# Patient Record
Sex: Female | Born: 1950
Health system: Southern US, Community
[De-identification: ages and names within clinical notes are randomized; demographics above are authoritative.]

## PROBLEM LIST (undated history)

## (undated) DIAGNOSIS — I10 Essential (primary) hypertension: Secondary | ICD-10-CM

## (undated) DIAGNOSIS — M1990 Unspecified osteoarthritis, unspecified site: Secondary | ICD-10-CM

## (undated) HISTORY — PX: GALLBLADDER SURGERY: SHX652

## (undated) HISTORY — PX: EYE MUSCLE SURGERY: SHX370

## (undated) HISTORY — PX: APPENDECTOMY: SHX54

## (undated) HISTORY — PX: PARTIAL HYSTERECTOMY: SHX80

## (undated) HISTORY — PX: BREAST SURGERY: SHX581

## (undated) HISTORY — PX: BACK SURGERY: SHX140

---

## 2015-09-15 DIAGNOSIS — K5900 Constipation, unspecified: Secondary | ICD-10-CM | POA: Diagnosis not present

## 2015-09-15 DIAGNOSIS — M5441 Lumbago with sciatica, right side: Secondary | ICD-10-CM | POA: Diagnosis not present

## 2015-09-24 DIAGNOSIS — E118 Type 2 diabetes mellitus with unspecified complications: Secondary | ICD-10-CM | POA: Diagnosis not present

## 2015-09-24 DIAGNOSIS — I10 Essential (primary) hypertension: Secondary | ICD-10-CM | POA: Diagnosis not present

## 2015-09-24 DIAGNOSIS — E1165 Type 2 diabetes mellitus with hyperglycemia: Secondary | ICD-10-CM | POA: Diagnosis not present

## 2015-09-24 DIAGNOSIS — M62838 Other muscle spasm: Secondary | ICD-10-CM | POA: Diagnosis not present

## 2015-10-29 DIAGNOSIS — Z299 Encounter for prophylactic measures, unspecified: Secondary | ICD-10-CM | POA: Diagnosis not present

## 2015-10-29 DIAGNOSIS — K219 Gastro-esophageal reflux disease without esophagitis: Secondary | ICD-10-CM | POA: Diagnosis not present

## 2015-10-29 DIAGNOSIS — Z1389 Encounter for screening for other disorder: Secondary | ICD-10-CM | POA: Diagnosis not present

## 2015-10-29 DIAGNOSIS — I10 Essential (primary) hypertension: Secondary | ICD-10-CM | POA: Diagnosis not present

## 2015-10-29 DIAGNOSIS — Z1211 Encounter for screening for malignant neoplasm of colon: Secondary | ICD-10-CM | POA: Diagnosis not present

## 2015-10-29 DIAGNOSIS — M545 Low back pain: Secondary | ICD-10-CM | POA: Diagnosis not present

## 2015-10-29 DIAGNOSIS — E118 Type 2 diabetes mellitus with unspecified complications: Secondary | ICD-10-CM | POA: Diagnosis not present

## 2015-10-29 DIAGNOSIS — Z7189 Other specified counseling: Secondary | ICD-10-CM | POA: Diagnosis not present

## 2015-10-29 DIAGNOSIS — Z0000 Encounter for general adult medical examination without abnormal findings: Secondary | ICD-10-CM | POA: Diagnosis not present

## 2015-10-31 DIAGNOSIS — Z79899 Other long term (current) drug therapy: Secondary | ICD-10-CM | POA: Diagnosis not present

## 2015-10-31 DIAGNOSIS — E7800 Pure hypercholesterolemia, unspecified: Secondary | ICD-10-CM | POA: Diagnosis not present

## 2015-10-31 DIAGNOSIS — R5383 Other fatigue: Secondary | ICD-10-CM | POA: Diagnosis not present

## 2015-11-04 DIAGNOSIS — M810 Age-related osteoporosis without current pathological fracture: Secondary | ICD-10-CM | POA: Diagnosis not present

## 2015-11-06 DIAGNOSIS — Z1231 Encounter for screening mammogram for malignant neoplasm of breast: Secondary | ICD-10-CM | POA: Diagnosis not present

## 2015-11-10 DIAGNOSIS — R7309 Other abnormal glucose: Secondary | ICD-10-CM | POA: Diagnosis not present

## 2015-11-10 DIAGNOSIS — M545 Low back pain: Secondary | ICD-10-CM | POA: Diagnosis not present

## 2015-11-21 DIAGNOSIS — M159 Polyosteoarthritis, unspecified: Secondary | ICD-10-CM | POA: Diagnosis not present

## 2015-11-21 DIAGNOSIS — E119 Type 2 diabetes mellitus without complications: Secondary | ICD-10-CM | POA: Diagnosis not present

## 2015-11-21 DIAGNOSIS — I10 Essential (primary) hypertension: Secondary | ICD-10-CM | POA: Diagnosis not present

## 2015-12-03 DIAGNOSIS — D649 Anemia, unspecified: Secondary | ICD-10-CM | POA: Diagnosis not present

## 2015-12-03 DIAGNOSIS — Z1211 Encounter for screening for malignant neoplasm of colon: Secondary | ICD-10-CM | POA: Diagnosis not present

## 2015-12-03 DIAGNOSIS — K5900 Constipation, unspecified: Secondary | ICD-10-CM | POA: Diagnosis not present

## 2015-12-03 DIAGNOSIS — Z800 Family history of malignant neoplasm of digestive organs: Secondary | ICD-10-CM | POA: Diagnosis not present

## 2015-12-29 DIAGNOSIS — M159 Polyosteoarthritis, unspecified: Secondary | ICD-10-CM | POA: Diagnosis not present

## 2015-12-29 DIAGNOSIS — I10 Essential (primary) hypertension: Secondary | ICD-10-CM | POA: Diagnosis not present

## 2015-12-29 DIAGNOSIS — E119 Type 2 diabetes mellitus without complications: Secondary | ICD-10-CM | POA: Diagnosis not present

## 2015-12-31 DIAGNOSIS — R600 Localized edema: Secondary | ICD-10-CM | POA: Diagnosis not present

## 2015-12-31 DIAGNOSIS — E1165 Type 2 diabetes mellitus with hyperglycemia: Secondary | ICD-10-CM | POA: Diagnosis not present

## 2015-12-31 DIAGNOSIS — Z299 Encounter for prophylactic measures, unspecified: Secondary | ICD-10-CM | POA: Diagnosis not present

## 2015-12-31 DIAGNOSIS — M545 Low back pain: Secondary | ICD-10-CM | POA: Diagnosis not present

## 2015-12-31 DIAGNOSIS — Z789 Other specified health status: Secondary | ICD-10-CM | POA: Diagnosis not present

## 2016-01-01 DIAGNOSIS — K529 Noninfective gastroenteritis and colitis, unspecified: Secondary | ICD-10-CM | POA: Diagnosis not present

## 2016-01-01 DIAGNOSIS — K635 Polyp of colon: Secondary | ICD-10-CM | POA: Diagnosis not present

## 2016-01-01 DIAGNOSIS — Z1211 Encounter for screening for malignant neoplasm of colon: Secondary | ICD-10-CM | POA: Diagnosis not present

## 2016-01-01 DIAGNOSIS — K5900 Constipation, unspecified: Secondary | ICD-10-CM | POA: Diagnosis not present

## 2016-01-28 DIAGNOSIS — K635 Polyp of colon: Secondary | ICD-10-CM | POA: Diagnosis not present

## 2016-01-28 DIAGNOSIS — D649 Anemia, unspecified: Secondary | ICD-10-CM | POA: Diagnosis not present

## 2016-01-28 DIAGNOSIS — Z1211 Encounter for screening for malignant neoplasm of colon: Secondary | ICD-10-CM | POA: Diagnosis not present

## 2016-01-28 DIAGNOSIS — K5900 Constipation, unspecified: Secondary | ICD-10-CM | POA: Diagnosis not present

## 2016-02-27 DIAGNOSIS — E119 Type 2 diabetes mellitus without complications: Secondary | ICD-10-CM | POA: Diagnosis not present

## 2016-02-27 DIAGNOSIS — I10 Essential (primary) hypertension: Secondary | ICD-10-CM | POA: Diagnosis not present

## 2016-02-27 DIAGNOSIS — M159 Polyosteoarthritis, unspecified: Secondary | ICD-10-CM | POA: Diagnosis not present

## 2016-04-06 DIAGNOSIS — E1165 Type 2 diabetes mellitus with hyperglycemia: Secondary | ICD-10-CM | POA: Diagnosis not present

## 2016-04-06 DIAGNOSIS — I10 Essential (primary) hypertension: Secondary | ICD-10-CM | POA: Diagnosis not present

## 2016-04-06 DIAGNOSIS — E118 Type 2 diabetes mellitus with unspecified complications: Secondary | ICD-10-CM | POA: Diagnosis not present

## 2016-04-06 DIAGNOSIS — Z299 Encounter for prophylactic measures, unspecified: Secondary | ICD-10-CM | POA: Diagnosis not present

## 2016-04-06 DIAGNOSIS — M545 Low back pain: Secondary | ICD-10-CM | POA: Diagnosis not present

## 2016-04-09 DIAGNOSIS — M159 Polyosteoarthritis, unspecified: Secondary | ICD-10-CM | POA: Diagnosis not present

## 2016-04-09 DIAGNOSIS — I10 Essential (primary) hypertension: Secondary | ICD-10-CM | POA: Diagnosis not present

## 2016-04-09 DIAGNOSIS — E119 Type 2 diabetes mellitus without complications: Secondary | ICD-10-CM | POA: Diagnosis not present

## 2016-05-13 DIAGNOSIS — I10 Essential (primary) hypertension: Secondary | ICD-10-CM | POA: Diagnosis not present

## 2016-05-13 DIAGNOSIS — E119 Type 2 diabetes mellitus without complications: Secondary | ICD-10-CM | POA: Diagnosis not present

## 2016-05-13 DIAGNOSIS — M159 Polyosteoarthritis, unspecified: Secondary | ICD-10-CM | POA: Diagnosis not present

## 2016-07-27 DIAGNOSIS — I10 Essential (primary) hypertension: Secondary | ICD-10-CM | POA: Diagnosis not present

## 2016-07-27 DIAGNOSIS — M25551 Pain in right hip: Secondary | ICD-10-CM | POA: Diagnosis not present

## 2016-07-27 DIAGNOSIS — E119 Type 2 diabetes mellitus without complications: Secondary | ICD-10-CM | POA: Diagnosis not present

## 2016-07-28 DIAGNOSIS — E119 Type 2 diabetes mellitus without complications: Secondary | ICD-10-CM | POA: Diagnosis not present

## 2016-07-28 DIAGNOSIS — M159 Polyosteoarthritis, unspecified: Secondary | ICD-10-CM | POA: Diagnosis not present

## 2016-07-28 DIAGNOSIS — I10 Essential (primary) hypertension: Secondary | ICD-10-CM | POA: Diagnosis not present

## 2016-09-24 DIAGNOSIS — I10 Essential (primary) hypertension: Secondary | ICD-10-CM | POA: Diagnosis not present

## 2016-09-24 DIAGNOSIS — M159 Polyosteoarthritis, unspecified: Secondary | ICD-10-CM | POA: Diagnosis not present

## 2016-09-24 DIAGNOSIS — E119 Type 2 diabetes mellitus without complications: Secondary | ICD-10-CM | POA: Diagnosis not present

## 2016-10-16 DIAGNOSIS — R5381 Other malaise: Secondary | ICD-10-CM | POA: Diagnosis not present

## 2016-10-16 DIAGNOSIS — J019 Acute sinusitis, unspecified: Secondary | ICD-10-CM | POA: Diagnosis not present

## 2016-10-16 DIAGNOSIS — R05 Cough: Secondary | ICD-10-CM | POA: Diagnosis not present

## 2016-10-25 DIAGNOSIS — M159 Polyosteoarthritis, unspecified: Secondary | ICD-10-CM | POA: Diagnosis not present

## 2016-10-25 DIAGNOSIS — E119 Type 2 diabetes mellitus without complications: Secondary | ICD-10-CM | POA: Diagnosis not present

## 2016-10-25 DIAGNOSIS — I10 Essential (primary) hypertension: Secondary | ICD-10-CM | POA: Diagnosis not present

## 2016-10-29 DIAGNOSIS — J309 Allergic rhinitis, unspecified: Secondary | ICD-10-CM | POA: Diagnosis not present

## 2016-10-29 DIAGNOSIS — Z1389 Encounter for screening for other disorder: Secondary | ICD-10-CM | POA: Diagnosis not present

## 2016-10-29 DIAGNOSIS — E7800 Pure hypercholesterolemia, unspecified: Secondary | ICD-10-CM | POA: Diagnosis not present

## 2016-10-29 DIAGNOSIS — Z6841 Body Mass Index (BMI) 40.0 and over, adult: Secondary | ICD-10-CM | POA: Diagnosis not present

## 2016-10-29 DIAGNOSIS — Z299 Encounter for prophylactic measures, unspecified: Secondary | ICD-10-CM | POA: Diagnosis not present

## 2016-10-29 DIAGNOSIS — Z7189 Other specified counseling: Secondary | ICD-10-CM | POA: Diagnosis not present

## 2016-10-29 DIAGNOSIS — R5383 Other fatigue: Secondary | ICD-10-CM | POA: Diagnosis not present

## 2016-10-29 DIAGNOSIS — I10 Essential (primary) hypertension: Secondary | ICD-10-CM | POA: Diagnosis not present

## 2016-10-29 DIAGNOSIS — Z0000 Encounter for general adult medical examination without abnormal findings: Secondary | ICD-10-CM | POA: Diagnosis not present

## 2016-10-29 DIAGNOSIS — Z79899 Other long term (current) drug therapy: Secondary | ICD-10-CM | POA: Diagnosis not present

## 2016-10-29 DIAGNOSIS — E1165 Type 2 diabetes mellitus with hyperglycemia: Secondary | ICD-10-CM | POA: Diagnosis not present

## 2016-11-08 DIAGNOSIS — Z1231 Encounter for screening mammogram for malignant neoplasm of breast: Secondary | ICD-10-CM | POA: Diagnosis not present

## 2016-11-24 DIAGNOSIS — M159 Polyosteoarthritis, unspecified: Secondary | ICD-10-CM | POA: Diagnosis not present

## 2016-11-24 DIAGNOSIS — I10 Essential (primary) hypertension: Secondary | ICD-10-CM | POA: Diagnosis not present

## 2016-11-24 DIAGNOSIS — E119 Type 2 diabetes mellitus without complications: Secondary | ICD-10-CM | POA: Diagnosis not present

## 2016-12-17 DIAGNOSIS — E119 Type 2 diabetes mellitus without complications: Secondary | ICD-10-CM | POA: Diagnosis not present

## 2016-12-17 DIAGNOSIS — I10 Essential (primary) hypertension: Secondary | ICD-10-CM | POA: Diagnosis not present

## 2016-12-17 DIAGNOSIS — M159 Polyosteoarthritis, unspecified: Secondary | ICD-10-CM | POA: Diagnosis not present

## 2017-01-20 DIAGNOSIS — I10 Essential (primary) hypertension: Secondary | ICD-10-CM | POA: Diagnosis not present

## 2017-01-20 DIAGNOSIS — E119 Type 2 diabetes mellitus without complications: Secondary | ICD-10-CM | POA: Diagnosis not present

## 2017-01-20 DIAGNOSIS — M159 Polyosteoarthritis, unspecified: Secondary | ICD-10-CM | POA: Diagnosis not present

## 2017-02-08 DIAGNOSIS — E119 Type 2 diabetes mellitus without complications: Secondary | ICD-10-CM | POA: Diagnosis not present

## 2017-02-08 DIAGNOSIS — M159 Polyosteoarthritis, unspecified: Secondary | ICD-10-CM | POA: Diagnosis not present

## 2017-02-08 DIAGNOSIS — I10 Essential (primary) hypertension: Secondary | ICD-10-CM | POA: Diagnosis not present

## 2017-03-11 DIAGNOSIS — Z6841 Body Mass Index (BMI) 40.0 and over, adult: Secondary | ICD-10-CM | POA: Diagnosis not present

## 2017-03-11 DIAGNOSIS — E7800 Pure hypercholesterolemia, unspecified: Secondary | ICD-10-CM | POA: Diagnosis not present

## 2017-03-11 DIAGNOSIS — E1165 Type 2 diabetes mellitus with hyperglycemia: Secondary | ICD-10-CM | POA: Diagnosis not present

## 2017-03-11 DIAGNOSIS — M545 Low back pain: Secondary | ICD-10-CM | POA: Diagnosis not present

## 2017-03-11 DIAGNOSIS — Z713 Dietary counseling and surveillance: Secondary | ICD-10-CM | POA: Diagnosis not present

## 2017-03-11 DIAGNOSIS — I10 Essential (primary) hypertension: Secondary | ICD-10-CM | POA: Diagnosis not present

## 2017-03-11 DIAGNOSIS — Z299 Encounter for prophylactic measures, unspecified: Secondary | ICD-10-CM | POA: Diagnosis not present

## 2017-04-22 DIAGNOSIS — I10 Essential (primary) hypertension: Secondary | ICD-10-CM | POA: Diagnosis not present

## 2017-04-22 DIAGNOSIS — E119 Type 2 diabetes mellitus without complications: Secondary | ICD-10-CM | POA: Diagnosis not present

## 2017-04-22 DIAGNOSIS — M159 Polyosteoarthritis, unspecified: Secondary | ICD-10-CM | POA: Diagnosis not present

## 2017-05-13 DIAGNOSIS — M159 Polyosteoarthritis, unspecified: Secondary | ICD-10-CM | POA: Diagnosis not present

## 2017-05-13 DIAGNOSIS — E119 Type 2 diabetes mellitus without complications: Secondary | ICD-10-CM | POA: Diagnosis not present

## 2017-05-13 DIAGNOSIS — I10 Essential (primary) hypertension: Secondary | ICD-10-CM | POA: Diagnosis not present

## 2017-06-17 DIAGNOSIS — Z299 Encounter for prophylactic measures, unspecified: Secondary | ICD-10-CM | POA: Diagnosis not present

## 2017-06-17 DIAGNOSIS — M545 Low back pain: Secondary | ICD-10-CM | POA: Diagnosis not present

## 2017-06-17 DIAGNOSIS — E1165 Type 2 diabetes mellitus with hyperglycemia: Secondary | ICD-10-CM | POA: Diagnosis not present

## 2017-06-17 DIAGNOSIS — Z6841 Body Mass Index (BMI) 40.0 and over, adult: Secondary | ICD-10-CM | POA: Diagnosis not present

## 2017-06-17 DIAGNOSIS — I10 Essential (primary) hypertension: Secondary | ICD-10-CM | POA: Diagnosis not present

## 2017-06-24 DIAGNOSIS — M159 Polyosteoarthritis, unspecified: Secondary | ICD-10-CM | POA: Diagnosis not present

## 2017-06-24 DIAGNOSIS — I10 Essential (primary) hypertension: Secondary | ICD-10-CM | POA: Diagnosis not present

## 2017-06-24 DIAGNOSIS — E119 Type 2 diabetes mellitus without complications: Secondary | ICD-10-CM | POA: Diagnosis not present

## 2017-07-20 DIAGNOSIS — M159 Polyosteoarthritis, unspecified: Secondary | ICD-10-CM | POA: Diagnosis not present

## 2017-07-20 DIAGNOSIS — I10 Essential (primary) hypertension: Secondary | ICD-10-CM | POA: Diagnosis not present

## 2017-07-20 DIAGNOSIS — E119 Type 2 diabetes mellitus without complications: Secondary | ICD-10-CM | POA: Diagnosis not present

## 2017-08-11 DIAGNOSIS — M159 Polyosteoarthritis, unspecified: Secondary | ICD-10-CM | POA: Diagnosis not present

## 2017-08-11 DIAGNOSIS — E119 Type 2 diabetes mellitus without complications: Secondary | ICD-10-CM | POA: Diagnosis not present

## 2017-08-11 DIAGNOSIS — I10 Essential (primary) hypertension: Secondary | ICD-10-CM | POA: Diagnosis not present

## 2017-09-09 DIAGNOSIS — M159 Polyosteoarthritis, unspecified: Secondary | ICD-10-CM | POA: Diagnosis not present

## 2017-09-09 DIAGNOSIS — I10 Essential (primary) hypertension: Secondary | ICD-10-CM | POA: Diagnosis not present

## 2017-09-09 DIAGNOSIS — E119 Type 2 diabetes mellitus without complications: Secondary | ICD-10-CM | POA: Diagnosis not present

## 2017-09-23 DIAGNOSIS — E1165 Type 2 diabetes mellitus with hyperglycemia: Secondary | ICD-10-CM | POA: Diagnosis not present

## 2017-09-23 DIAGNOSIS — M545 Low back pain: Secondary | ICD-10-CM | POA: Diagnosis not present

## 2017-09-23 DIAGNOSIS — Z79899 Other long term (current) drug therapy: Secondary | ICD-10-CM | POA: Diagnosis not present

## 2017-09-23 DIAGNOSIS — Z299 Encounter for prophylactic measures, unspecified: Secondary | ICD-10-CM | POA: Diagnosis not present

## 2017-10-05 DIAGNOSIS — I10 Essential (primary) hypertension: Secondary | ICD-10-CM | POA: Diagnosis not present

## 2017-10-05 DIAGNOSIS — M159 Polyosteoarthritis, unspecified: Secondary | ICD-10-CM | POA: Diagnosis not present

## 2017-10-05 DIAGNOSIS — E119 Type 2 diabetes mellitus without complications: Secondary | ICD-10-CM | POA: Diagnosis not present

## 2017-11-01 DIAGNOSIS — Z0131 Encounter for examination of blood pressure with abnormal findings: Secondary | ICD-10-CM | POA: Diagnosis not present

## 2017-11-01 DIAGNOSIS — M549 Dorsalgia, unspecified: Secondary | ICD-10-CM | POA: Diagnosis not present

## 2017-11-04 DIAGNOSIS — E119 Type 2 diabetes mellitus without complications: Secondary | ICD-10-CM | POA: Diagnosis not present

## 2017-11-04 DIAGNOSIS — I10 Essential (primary) hypertension: Secondary | ICD-10-CM | POA: Diagnosis not present

## 2017-11-04 DIAGNOSIS — M159 Polyosteoarthritis, unspecified: Secondary | ICD-10-CM | POA: Diagnosis not present

## 2017-11-08 DIAGNOSIS — I10 Essential (primary) hypertension: Secondary | ICD-10-CM | POA: Diagnosis not present

## 2017-11-08 DIAGNOSIS — Z6841 Body Mass Index (BMI) 40.0 and over, adult: Secondary | ICD-10-CM | POA: Diagnosis not present

## 2017-11-08 DIAGNOSIS — Z1331 Encounter for screening for depression: Secondary | ICD-10-CM | POA: Diagnosis not present

## 2017-11-08 DIAGNOSIS — Z7189 Other specified counseling: Secondary | ICD-10-CM | POA: Diagnosis not present

## 2017-11-08 DIAGNOSIS — R5383 Other fatigue: Secondary | ICD-10-CM | POA: Diagnosis not present

## 2017-11-08 DIAGNOSIS — Z79899 Other long term (current) drug therapy: Secondary | ICD-10-CM | POA: Diagnosis not present

## 2017-11-08 DIAGNOSIS — Z1211 Encounter for screening for malignant neoplasm of colon: Secondary | ICD-10-CM | POA: Diagnosis not present

## 2017-11-08 DIAGNOSIS — E7800 Pure hypercholesterolemia, unspecified: Secondary | ICD-10-CM | POA: Diagnosis not present

## 2017-11-08 DIAGNOSIS — Z1339 Encounter for screening examination for other mental health and behavioral disorders: Secondary | ICD-10-CM | POA: Diagnosis not present

## 2017-11-08 DIAGNOSIS — E1165 Type 2 diabetes mellitus with hyperglycemia: Secondary | ICD-10-CM | POA: Diagnosis not present

## 2017-11-08 DIAGNOSIS — Z0000 Encounter for general adult medical examination without abnormal findings: Secondary | ICD-10-CM | POA: Diagnosis not present

## 2017-11-08 DIAGNOSIS — Z299 Encounter for prophylactic measures, unspecified: Secondary | ICD-10-CM | POA: Diagnosis not present

## 2017-11-08 DIAGNOSIS — M5416 Radiculopathy, lumbar region: Secondary | ICD-10-CM | POA: Diagnosis not present

## 2017-11-21 DIAGNOSIS — Z1231 Encounter for screening mammogram for malignant neoplasm of breast: Secondary | ICD-10-CM | POA: Diagnosis not present

## 2017-11-21 DIAGNOSIS — R921 Mammographic calcification found on diagnostic imaging of breast: Secondary | ICD-10-CM | POA: Diagnosis not present

## 2017-11-29 DIAGNOSIS — M545 Low back pain: Secondary | ICD-10-CM | POA: Diagnosis not present

## 2017-11-29 DIAGNOSIS — M5136 Other intervertebral disc degeneration, lumbar region: Secondary | ICD-10-CM | POA: Diagnosis not present

## 2017-12-09 DIAGNOSIS — I10 Essential (primary) hypertension: Secondary | ICD-10-CM | POA: Diagnosis not present

## 2017-12-09 DIAGNOSIS — M159 Polyosteoarthritis, unspecified: Secondary | ICD-10-CM | POA: Diagnosis not present

## 2017-12-09 DIAGNOSIS — E119 Type 2 diabetes mellitus without complications: Secondary | ICD-10-CM | POA: Diagnosis not present

## 2017-12-13 DIAGNOSIS — M5136 Other intervertebral disc degeneration, lumbar region: Secondary | ICD-10-CM | POA: Diagnosis not present

## 2017-12-13 DIAGNOSIS — M545 Low back pain: Secondary | ICD-10-CM | POA: Diagnosis not present

## 2017-12-13 DIAGNOSIS — M4726 Other spondylosis with radiculopathy, lumbar region: Secondary | ICD-10-CM | POA: Diagnosis not present

## 2017-12-13 DIAGNOSIS — M6281 Muscle weakness (generalized): Secondary | ICD-10-CM | POA: Diagnosis not present

## 2017-12-15 DIAGNOSIS — M545 Low back pain: Secondary | ICD-10-CM | POA: Diagnosis not present

## 2017-12-15 DIAGNOSIS — M5136 Other intervertebral disc degeneration, lumbar region: Secondary | ICD-10-CM | POA: Diagnosis not present

## 2017-12-15 DIAGNOSIS — M4726 Other spondylosis with radiculopathy, lumbar region: Secondary | ICD-10-CM | POA: Diagnosis not present

## 2017-12-15 DIAGNOSIS — M6281 Muscle weakness (generalized): Secondary | ICD-10-CM | POA: Diagnosis not present

## 2017-12-20 DIAGNOSIS — M4726 Other spondylosis with radiculopathy, lumbar region: Secondary | ICD-10-CM | POA: Diagnosis not present

## 2017-12-20 DIAGNOSIS — M5136 Other intervertebral disc degeneration, lumbar region: Secondary | ICD-10-CM | POA: Diagnosis not present

## 2017-12-20 DIAGNOSIS — M6281 Muscle weakness (generalized): Secondary | ICD-10-CM | POA: Diagnosis not present

## 2017-12-20 DIAGNOSIS — M545 Low back pain: Secondary | ICD-10-CM | POA: Diagnosis not present

## 2017-12-22 DIAGNOSIS — M6281 Muscle weakness (generalized): Secondary | ICD-10-CM | POA: Diagnosis not present

## 2017-12-22 DIAGNOSIS — M4726 Other spondylosis with radiculopathy, lumbar region: Secondary | ICD-10-CM | POA: Diagnosis not present

## 2017-12-22 DIAGNOSIS — M545 Low back pain: Secondary | ICD-10-CM | POA: Diagnosis not present

## 2017-12-22 DIAGNOSIS — M5136 Other intervertebral disc degeneration, lumbar region: Secondary | ICD-10-CM | POA: Diagnosis not present

## 2017-12-27 DIAGNOSIS — M5136 Other intervertebral disc degeneration, lumbar region: Secondary | ICD-10-CM | POA: Diagnosis not present

## 2017-12-27 DIAGNOSIS — M6281 Muscle weakness (generalized): Secondary | ICD-10-CM | POA: Diagnosis not present

## 2017-12-27 DIAGNOSIS — M545 Low back pain: Secondary | ICD-10-CM | POA: Diagnosis not present

## 2017-12-27 DIAGNOSIS — M4726 Other spondylosis with radiculopathy, lumbar region: Secondary | ICD-10-CM | POA: Diagnosis not present

## 2017-12-29 DIAGNOSIS — M6281 Muscle weakness (generalized): Secondary | ICD-10-CM | POA: Diagnosis not present

## 2017-12-29 DIAGNOSIS — Z299 Encounter for prophylactic measures, unspecified: Secondary | ICD-10-CM | POA: Diagnosis not present

## 2017-12-29 DIAGNOSIS — E118 Type 2 diabetes mellitus with unspecified complications: Secondary | ICD-10-CM | POA: Diagnosis not present

## 2017-12-29 DIAGNOSIS — I10 Essential (primary) hypertension: Secondary | ICD-10-CM | POA: Diagnosis not present

## 2017-12-29 DIAGNOSIS — E1165 Type 2 diabetes mellitus with hyperglycemia: Secondary | ICD-10-CM | POA: Diagnosis not present

## 2017-12-29 DIAGNOSIS — M4726 Other spondylosis with radiculopathy, lumbar region: Secondary | ICD-10-CM | POA: Diagnosis not present

## 2017-12-29 DIAGNOSIS — Z6841 Body Mass Index (BMI) 40.0 and over, adult: Secondary | ICD-10-CM | POA: Diagnosis not present

## 2017-12-29 DIAGNOSIS — M5136 Other intervertebral disc degeneration, lumbar region: Secondary | ICD-10-CM | POA: Diagnosis not present

## 2017-12-29 DIAGNOSIS — M545 Low back pain: Secondary | ICD-10-CM | POA: Diagnosis not present

## 2018-01-03 DIAGNOSIS — M6281 Muscle weakness (generalized): Secondary | ICD-10-CM | POA: Diagnosis not present

## 2018-01-03 DIAGNOSIS — M5136 Other intervertebral disc degeneration, lumbar region: Secondary | ICD-10-CM | POA: Diagnosis not present

## 2018-01-03 DIAGNOSIS — M545 Low back pain: Secondary | ICD-10-CM | POA: Diagnosis not present

## 2018-01-03 DIAGNOSIS — M4726 Other spondylosis with radiculopathy, lumbar region: Secondary | ICD-10-CM | POA: Diagnosis not present

## 2018-01-16 DIAGNOSIS — Z299 Encounter for prophylactic measures, unspecified: Secondary | ICD-10-CM | POA: Diagnosis not present

## 2018-01-16 DIAGNOSIS — I10 Essential (primary) hypertension: Secondary | ICD-10-CM | POA: Diagnosis not present

## 2018-01-16 DIAGNOSIS — E1165 Type 2 diabetes mellitus with hyperglycemia: Secondary | ICD-10-CM | POA: Diagnosis not present

## 2018-01-16 DIAGNOSIS — Z6841 Body Mass Index (BMI) 40.0 and over, adult: Secondary | ICD-10-CM | POA: Diagnosis not present

## 2018-01-16 DIAGNOSIS — R0683 Snoring: Secondary | ICD-10-CM | POA: Diagnosis not present

## 2018-01-16 DIAGNOSIS — R0600 Dyspnea, unspecified: Secondary | ICD-10-CM | POA: Diagnosis not present

## 2018-01-17 DIAGNOSIS — M159 Polyosteoarthritis, unspecified: Secondary | ICD-10-CM | POA: Diagnosis not present

## 2018-01-17 DIAGNOSIS — E119 Type 2 diabetes mellitus without complications: Secondary | ICD-10-CM | POA: Diagnosis not present

## 2018-01-17 DIAGNOSIS — I10 Essential (primary) hypertension: Secondary | ICD-10-CM | POA: Diagnosis not present

## 2018-01-30 DIAGNOSIS — R0602 Shortness of breath: Secondary | ICD-10-CM | POA: Diagnosis not present

## 2018-02-03 ENCOUNTER — Institutional Professional Consult (permissible substitution): Payer: Self-pay | Admitting: Pulmonary Disease

## 2018-02-08 DIAGNOSIS — I10 Essential (primary) hypertension: Secondary | ICD-10-CM | POA: Diagnosis not present

## 2018-02-08 DIAGNOSIS — M159 Polyosteoarthritis, unspecified: Secondary | ICD-10-CM | POA: Diagnosis not present

## 2018-02-08 DIAGNOSIS — E119 Type 2 diabetes mellitus without complications: Secondary | ICD-10-CM | POA: Diagnosis not present

## 2018-03-31 DIAGNOSIS — I10 Essential (primary) hypertension: Secondary | ICD-10-CM | POA: Diagnosis not present

## 2018-03-31 DIAGNOSIS — M159 Polyosteoarthritis, unspecified: Secondary | ICD-10-CM | POA: Diagnosis not present

## 2018-03-31 DIAGNOSIS — E119 Type 2 diabetes mellitus without complications: Secondary | ICD-10-CM | POA: Diagnosis not present

## 2018-04-07 DIAGNOSIS — Z299 Encounter for prophylactic measures, unspecified: Secondary | ICD-10-CM | POA: Diagnosis not present

## 2018-04-07 DIAGNOSIS — M47816 Spondylosis without myelopathy or radiculopathy, lumbar region: Secondary | ICD-10-CM | POA: Diagnosis not present

## 2018-04-07 DIAGNOSIS — E1165 Type 2 diabetes mellitus with hyperglycemia: Secondary | ICD-10-CM | POA: Diagnosis not present

## 2018-04-07 DIAGNOSIS — Z6841 Body Mass Index (BMI) 40.0 and over, adult: Secondary | ICD-10-CM | POA: Diagnosis not present

## 2018-04-07 DIAGNOSIS — I10 Essential (primary) hypertension: Secondary | ICD-10-CM | POA: Diagnosis not present

## 2018-05-01 DIAGNOSIS — I10 Essential (primary) hypertension: Secondary | ICD-10-CM | POA: Diagnosis not present

## 2018-05-01 DIAGNOSIS — E119 Type 2 diabetes mellitus without complications: Secondary | ICD-10-CM | POA: Diagnosis not present

## 2018-05-01 DIAGNOSIS — M159 Polyosteoarthritis, unspecified: Secondary | ICD-10-CM | POA: Diagnosis not present

## 2018-05-26 DIAGNOSIS — Z299 Encounter for prophylactic measures, unspecified: Secondary | ICD-10-CM | POA: Diagnosis not present

## 2018-05-26 DIAGNOSIS — Z789 Other specified health status: Secondary | ICD-10-CM | POA: Diagnosis not present

## 2018-05-26 DIAGNOSIS — J40 Bronchitis, not specified as acute or chronic: Secondary | ICD-10-CM | POA: Diagnosis not present

## 2018-05-26 DIAGNOSIS — Z6841 Body Mass Index (BMI) 40.0 and over, adult: Secondary | ICD-10-CM | POA: Diagnosis not present

## 2018-05-26 DIAGNOSIS — I10 Essential (primary) hypertension: Secondary | ICD-10-CM | POA: Diagnosis not present

## 2018-05-29 DIAGNOSIS — M159 Polyosteoarthritis, unspecified: Secondary | ICD-10-CM | POA: Diagnosis not present

## 2018-05-29 DIAGNOSIS — I10 Essential (primary) hypertension: Secondary | ICD-10-CM | POA: Diagnosis not present

## 2018-05-29 DIAGNOSIS — E119 Type 2 diabetes mellitus without complications: Secondary | ICD-10-CM | POA: Diagnosis not present

## 2018-08-04 DIAGNOSIS — Z79899 Other long term (current) drug therapy: Secondary | ICD-10-CM | POA: Diagnosis not present

## 2018-08-04 DIAGNOSIS — I10 Essential (primary) hypertension: Secondary | ICD-10-CM | POA: Diagnosis not present

## 2018-08-04 DIAGNOSIS — Z299 Encounter for prophylactic measures, unspecified: Secondary | ICD-10-CM | POA: Diagnosis not present

## 2018-08-04 DIAGNOSIS — E1165 Type 2 diabetes mellitus with hyperglycemia: Secondary | ICD-10-CM | POA: Diagnosis not present

## 2018-08-04 DIAGNOSIS — M47816 Spondylosis without myelopathy or radiculopathy, lumbar region: Secondary | ICD-10-CM | POA: Diagnosis not present

## 2018-08-04 DIAGNOSIS — Z6841 Body Mass Index (BMI) 40.0 and over, adult: Secondary | ICD-10-CM | POA: Diagnosis not present

## 2018-08-11 DIAGNOSIS — I10 Essential (primary) hypertension: Secondary | ICD-10-CM | POA: Diagnosis not present

## 2018-08-11 DIAGNOSIS — E119 Type 2 diabetes mellitus without complications: Secondary | ICD-10-CM | POA: Diagnosis not present

## 2018-08-11 DIAGNOSIS — M159 Polyosteoarthritis, unspecified: Secondary | ICD-10-CM | POA: Diagnosis not present

## 2018-09-08 DIAGNOSIS — I10 Essential (primary) hypertension: Secondary | ICD-10-CM | POA: Diagnosis not present

## 2018-09-08 DIAGNOSIS — E1165 Type 2 diabetes mellitus with hyperglycemia: Secondary | ICD-10-CM | POA: Diagnosis not present

## 2018-09-08 DIAGNOSIS — Z6841 Body Mass Index (BMI) 40.0 and over, adult: Secondary | ICD-10-CM | POA: Diagnosis not present

## 2018-09-08 DIAGNOSIS — Z299 Encounter for prophylactic measures, unspecified: Secondary | ICD-10-CM | POA: Diagnosis not present

## 2018-10-10 DIAGNOSIS — E119 Type 2 diabetes mellitus without complications: Secondary | ICD-10-CM | POA: Diagnosis not present

## 2018-10-10 DIAGNOSIS — M159 Polyosteoarthritis, unspecified: Secondary | ICD-10-CM | POA: Diagnosis not present

## 2018-10-10 DIAGNOSIS — I10 Essential (primary) hypertension: Secondary | ICD-10-CM | POA: Diagnosis not present

## 2018-11-10 DIAGNOSIS — M159 Polyosteoarthritis, unspecified: Secondary | ICD-10-CM | POA: Diagnosis not present

## 2018-11-13 DIAGNOSIS — Z1331 Encounter for screening for depression: Secondary | ICD-10-CM | POA: Diagnosis not present

## 2018-11-13 DIAGNOSIS — E1165 Type 2 diabetes mellitus with hyperglycemia: Secondary | ICD-10-CM | POA: Diagnosis not present

## 2018-11-13 DIAGNOSIS — R5383 Other fatigue: Secondary | ICD-10-CM | POA: Diagnosis not present

## 2018-11-13 DIAGNOSIS — D509 Iron deficiency anemia, unspecified: Secondary | ICD-10-CM | POA: Diagnosis not present

## 2018-11-13 DIAGNOSIS — Z299 Encounter for prophylactic measures, unspecified: Secondary | ICD-10-CM | POA: Diagnosis not present

## 2018-11-13 DIAGNOSIS — Z7189 Other specified counseling: Secondary | ICD-10-CM | POA: Diagnosis not present

## 2018-11-13 DIAGNOSIS — I10 Essential (primary) hypertension: Secondary | ICD-10-CM | POA: Diagnosis not present

## 2018-11-13 DIAGNOSIS — Z0000 Encounter for general adult medical examination without abnormal findings: Secondary | ICD-10-CM | POA: Diagnosis not present

## 2018-11-13 DIAGNOSIS — Z1211 Encounter for screening for malignant neoplasm of colon: Secondary | ICD-10-CM | POA: Diagnosis not present

## 2018-11-13 DIAGNOSIS — E7800 Pure hypercholesterolemia, unspecified: Secondary | ICD-10-CM | POA: Diagnosis not present

## 2018-11-13 DIAGNOSIS — Z79899 Other long term (current) drug therapy: Secondary | ICD-10-CM | POA: Diagnosis not present

## 2018-11-13 DIAGNOSIS — Z1339 Encounter for screening examination for other mental health and behavioral disorders: Secondary | ICD-10-CM | POA: Diagnosis not present

## 2018-11-13 DIAGNOSIS — M545 Low back pain: Secondary | ICD-10-CM | POA: Diagnosis not present

## 2018-11-13 DIAGNOSIS — Z6841 Body Mass Index (BMI) 40.0 and over, adult: Secondary | ICD-10-CM | POA: Diagnosis not present

## 2018-12-01 DIAGNOSIS — I10 Essential (primary) hypertension: Secondary | ICD-10-CM | POA: Diagnosis not present

## 2018-12-08 DIAGNOSIS — M159 Polyosteoarthritis, unspecified: Secondary | ICD-10-CM | POA: Diagnosis not present

## 2018-12-19 DIAGNOSIS — I10 Essential (primary) hypertension: Secondary | ICD-10-CM | POA: Diagnosis not present

## 2018-12-25 DIAGNOSIS — Z1231 Encounter for screening mammogram for malignant neoplasm of breast: Secondary | ICD-10-CM | POA: Diagnosis not present

## 2018-12-27 DIAGNOSIS — R5383 Other fatigue: Secondary | ICD-10-CM | POA: Diagnosis not present

## 2019-01-03 DIAGNOSIS — Z86010 Personal history of colonic polyps: Secondary | ICD-10-CM | POA: Diagnosis not present

## 2019-01-03 DIAGNOSIS — D509 Iron deficiency anemia, unspecified: Secondary | ICD-10-CM | POA: Diagnosis not present

## 2019-01-03 DIAGNOSIS — Z01818 Encounter for other preprocedural examination: Secondary | ICD-10-CM | POA: Diagnosis not present

## 2019-01-03 DIAGNOSIS — E669 Obesity, unspecified: Secondary | ICD-10-CM | POA: Diagnosis not present

## 2019-01-03 DIAGNOSIS — Z1211 Encounter for screening for malignant neoplasm of colon: Secondary | ICD-10-CM | POA: Diagnosis not present

## 2019-01-03 DIAGNOSIS — Z800 Family history of malignant neoplasm of digestive organs: Secondary | ICD-10-CM | POA: Diagnosis not present

## 2019-01-08 DIAGNOSIS — M159 Polyosteoarthritis, unspecified: Secondary | ICD-10-CM | POA: Diagnosis not present

## 2019-01-11 DIAGNOSIS — Z86010 Personal history of colonic polyps: Secondary | ICD-10-CM | POA: Diagnosis not present

## 2019-01-11 DIAGNOSIS — D126 Benign neoplasm of colon, unspecified: Secondary | ICD-10-CM | POA: Diagnosis not present

## 2019-01-11 DIAGNOSIS — K635 Polyp of colon: Secondary | ICD-10-CM | POA: Diagnosis not present

## 2019-01-11 DIAGNOSIS — Z1211 Encounter for screening for malignant neoplasm of colon: Secondary | ICD-10-CM | POA: Diagnosis not present

## 2019-01-31 DIAGNOSIS — I10 Essential (primary) hypertension: Secondary | ICD-10-CM | POA: Diagnosis not present

## 2019-02-21 DIAGNOSIS — I10 Essential (primary) hypertension: Secondary | ICD-10-CM | POA: Diagnosis not present

## 2019-02-28 DIAGNOSIS — M159 Polyosteoarthritis, unspecified: Secondary | ICD-10-CM | POA: Diagnosis not present

## 2019-03-05 DIAGNOSIS — D509 Iron deficiency anemia, unspecified: Secondary | ICD-10-CM | POA: Diagnosis not present

## 2019-03-05 DIAGNOSIS — Z800 Family history of malignant neoplasm of digestive organs: Secondary | ICD-10-CM | POA: Diagnosis not present

## 2019-03-05 DIAGNOSIS — Z86010 Personal history of colonic polyps: Secondary | ICD-10-CM | POA: Diagnosis not present

## 2019-03-27 DIAGNOSIS — I10 Essential (primary) hypertension: Secondary | ICD-10-CM | POA: Diagnosis not present

## 2019-03-30 DIAGNOSIS — I10 Essential (primary) hypertension: Secondary | ICD-10-CM | POA: Diagnosis not present

## 2019-03-30 DIAGNOSIS — E1165 Type 2 diabetes mellitus with hyperglycemia: Secondary | ICD-10-CM | POA: Diagnosis not present

## 2019-03-30 DIAGNOSIS — Z299 Encounter for prophylactic measures, unspecified: Secondary | ICD-10-CM | POA: Diagnosis not present

## 2019-03-30 DIAGNOSIS — Z23 Encounter for immunization: Secondary | ICD-10-CM | POA: Diagnosis not present

## 2019-03-30 DIAGNOSIS — Z6841 Body Mass Index (BMI) 40.0 and over, adult: Secondary | ICD-10-CM | POA: Diagnosis not present

## 2019-03-30 DIAGNOSIS — M545 Low back pain: Secondary | ICD-10-CM | POA: Diagnosis not present

## 2019-04-17 DIAGNOSIS — M159 Polyosteoarthritis, unspecified: Secondary | ICD-10-CM | POA: Diagnosis not present

## 2019-04-25 DIAGNOSIS — I10 Essential (primary) hypertension: Secondary | ICD-10-CM | POA: Diagnosis not present

## 2019-06-06 DIAGNOSIS — D509 Iron deficiency anemia, unspecified: Secondary | ICD-10-CM | POA: Diagnosis not present

## 2019-06-06 DIAGNOSIS — E669 Obesity, unspecified: Secondary | ICD-10-CM | POA: Diagnosis not present

## 2019-06-06 DIAGNOSIS — Z800 Family history of malignant neoplasm of digestive organs: Secondary | ICD-10-CM | POA: Diagnosis not present

## 2019-06-06 DIAGNOSIS — Z86010 Personal history of colonic polyps: Secondary | ICD-10-CM | POA: Diagnosis not present

## 2019-06-11 DIAGNOSIS — Z01818 Encounter for other preprocedural examination: Secondary | ICD-10-CM | POA: Diagnosis not present

## 2019-06-19 DIAGNOSIS — Z800 Family history of malignant neoplasm of digestive organs: Secondary | ICD-10-CM | POA: Diagnosis not present

## 2019-06-19 DIAGNOSIS — I10 Essential (primary) hypertension: Secondary | ICD-10-CM | POA: Diagnosis not present

## 2019-06-19 DIAGNOSIS — D509 Iron deficiency anemia, unspecified: Secondary | ICD-10-CM | POA: Diagnosis not present

## 2019-06-19 DIAGNOSIS — K449 Diaphragmatic hernia without obstruction or gangrene: Secondary | ICD-10-CM | POA: Diagnosis not present

## 2019-06-19 DIAGNOSIS — K222 Esophageal obstruction: Secondary | ICD-10-CM | POA: Diagnosis not present

## 2019-06-19 DIAGNOSIS — K3189 Other diseases of stomach and duodenum: Secondary | ICD-10-CM | POA: Diagnosis not present

## 2019-06-19 DIAGNOSIS — K317 Polyp of stomach and duodenum: Secondary | ICD-10-CM | POA: Diagnosis not present

## 2019-07-04 DIAGNOSIS — M159 Polyosteoarthritis, unspecified: Secondary | ICD-10-CM | POA: Diagnosis not present

## 2019-07-16 DIAGNOSIS — E1165 Type 2 diabetes mellitus with hyperglycemia: Secondary | ICD-10-CM | POA: Diagnosis not present

## 2019-07-16 DIAGNOSIS — I10 Essential (primary) hypertension: Secondary | ICD-10-CM | POA: Diagnosis not present

## 2019-07-16 DIAGNOSIS — E119 Type 2 diabetes mellitus without complications: Secondary | ICD-10-CM | POA: Diagnosis not present

## 2019-07-16 DIAGNOSIS — K219 Gastro-esophageal reflux disease without esophagitis: Secondary | ICD-10-CM | POA: Diagnosis not present

## 2019-07-16 DIAGNOSIS — Z6841 Body Mass Index (BMI) 40.0 and over, adult: Secondary | ICD-10-CM | POA: Diagnosis not present

## 2019-07-16 DIAGNOSIS — Z299 Encounter for prophylactic measures, unspecified: Secondary | ICD-10-CM | POA: Diagnosis not present

## 2019-07-16 DIAGNOSIS — M47816 Spondylosis without myelopathy or radiculopathy, lumbar region: Secondary | ICD-10-CM | POA: Diagnosis not present

## 2019-08-03 DIAGNOSIS — I10 Essential (primary) hypertension: Secondary | ICD-10-CM | POA: Diagnosis not present

## 2019-08-15 DIAGNOSIS — M159 Polyosteoarthritis, unspecified: Secondary | ICD-10-CM | POA: Diagnosis not present

## 2019-09-02 DIAGNOSIS — I10 Essential (primary) hypertension: Secondary | ICD-10-CM | POA: Diagnosis not present

## 2019-09-10 DIAGNOSIS — M159 Polyosteoarthritis, unspecified: Secondary | ICD-10-CM | POA: Diagnosis not present

## 2019-10-02 DIAGNOSIS — I10 Essential (primary) hypertension: Secondary | ICD-10-CM | POA: Diagnosis not present

## 2019-10-22 DIAGNOSIS — Z299 Encounter for prophylactic measures, unspecified: Secondary | ICD-10-CM | POA: Diagnosis not present

## 2019-10-22 DIAGNOSIS — E7800 Pure hypercholesterolemia, unspecified: Secondary | ICD-10-CM | POA: Diagnosis not present

## 2019-10-22 DIAGNOSIS — Z6841 Body Mass Index (BMI) 40.0 and over, adult: Secondary | ICD-10-CM | POA: Diagnosis not present

## 2019-10-22 DIAGNOSIS — I10 Essential (primary) hypertension: Secondary | ICD-10-CM | POA: Diagnosis not present

## 2019-10-22 DIAGNOSIS — F322 Major depressive disorder, single episode, severe without psychotic features: Secondary | ICD-10-CM | POA: Diagnosis not present

## 2019-10-22 DIAGNOSIS — E1165 Type 2 diabetes mellitus with hyperglycemia: Secondary | ICD-10-CM | POA: Diagnosis not present

## 2019-11-02 DIAGNOSIS — I10 Essential (primary) hypertension: Secondary | ICD-10-CM | POA: Diagnosis not present

## 2019-11-20 DIAGNOSIS — E7800 Pure hypercholesterolemia, unspecified: Secondary | ICD-10-CM | POA: Diagnosis not present

## 2019-11-20 DIAGNOSIS — Z6841 Body Mass Index (BMI) 40.0 and over, adult: Secondary | ICD-10-CM | POA: Diagnosis not present

## 2019-11-20 DIAGNOSIS — Z7189 Other specified counseling: Secondary | ICD-10-CM | POA: Diagnosis not present

## 2019-11-20 DIAGNOSIS — Z299 Encounter for prophylactic measures, unspecified: Secondary | ICD-10-CM | POA: Diagnosis not present

## 2019-11-20 DIAGNOSIS — I10 Essential (primary) hypertension: Secondary | ICD-10-CM | POA: Diagnosis not present

## 2019-11-20 DIAGNOSIS — E1165 Type 2 diabetes mellitus with hyperglycemia: Secondary | ICD-10-CM | POA: Diagnosis not present

## 2019-11-20 DIAGNOSIS — R5383 Other fatigue: Secondary | ICD-10-CM | POA: Diagnosis not present

## 2019-11-20 DIAGNOSIS — Z1331 Encounter for screening for depression: Secondary | ICD-10-CM | POA: Diagnosis not present

## 2019-11-20 DIAGNOSIS — Z0000 Encounter for general adult medical examination without abnormal findings: Secondary | ICD-10-CM | POA: Diagnosis not present

## 2019-11-20 DIAGNOSIS — Z1339 Encounter for screening examination for other mental health and behavioral disorders: Secondary | ICD-10-CM | POA: Diagnosis not present

## 2019-11-20 DIAGNOSIS — Z1211 Encounter for screening for malignant neoplasm of colon: Secondary | ICD-10-CM | POA: Diagnosis not present

## 2019-11-30 DIAGNOSIS — E2839 Other primary ovarian failure: Secondary | ICD-10-CM | POA: Diagnosis not present

## 2019-12-02 DIAGNOSIS — M159 Polyosteoarthritis, unspecified: Secondary | ICD-10-CM | POA: Diagnosis not present

## 2019-12-02 DIAGNOSIS — I10 Essential (primary) hypertension: Secondary | ICD-10-CM | POA: Diagnosis not present

## 2020-01-02 DIAGNOSIS — M159 Polyosteoarthritis, unspecified: Secondary | ICD-10-CM | POA: Diagnosis not present

## 2020-01-02 DIAGNOSIS — I10 Essential (primary) hypertension: Secondary | ICD-10-CM | POA: Diagnosis not present

## 2020-01-31 DIAGNOSIS — Z299 Encounter for prophylactic measures, unspecified: Secondary | ICD-10-CM | POA: Diagnosis not present

## 2020-01-31 DIAGNOSIS — E1122 Type 2 diabetes mellitus with diabetic chronic kidney disease: Secondary | ICD-10-CM | POA: Diagnosis not present

## 2020-01-31 DIAGNOSIS — M47816 Spondylosis without myelopathy or radiculopathy, lumbar region: Secondary | ICD-10-CM | POA: Diagnosis not present

## 2020-01-31 DIAGNOSIS — R1030 Lower abdominal pain, unspecified: Secondary | ICD-10-CM | POA: Diagnosis not present

## 2020-01-31 DIAGNOSIS — Z6841 Body Mass Index (BMI) 40.0 and over, adult: Secondary | ICD-10-CM | POA: Diagnosis not present

## 2020-01-31 DIAGNOSIS — E1165 Type 2 diabetes mellitus with hyperglycemia: Secondary | ICD-10-CM | POA: Diagnosis not present

## 2020-01-31 DIAGNOSIS — I10 Essential (primary) hypertension: Secondary | ICD-10-CM | POA: Diagnosis not present

## 2020-02-01 DIAGNOSIS — M159 Polyosteoarthritis, unspecified: Secondary | ICD-10-CM | POA: Diagnosis not present

## 2020-02-01 DIAGNOSIS — I10 Essential (primary) hypertension: Secondary | ICD-10-CM | POA: Diagnosis not present

## 2020-02-05 DIAGNOSIS — E1159 Type 2 diabetes mellitus with other circulatory complications: Secondary | ICD-10-CM | POA: Diagnosis not present

## 2020-02-05 DIAGNOSIS — E1143 Type 2 diabetes mellitus with diabetic autonomic (poly)neuropathy: Secondary | ICD-10-CM | POA: Diagnosis not present

## 2020-02-07 DIAGNOSIS — M159 Polyosteoarthritis, unspecified: Secondary | ICD-10-CM | POA: Diagnosis not present

## 2020-02-07 DIAGNOSIS — I10 Essential (primary) hypertension: Secondary | ICD-10-CM | POA: Diagnosis not present

## 2020-02-14 DIAGNOSIS — N3289 Other specified disorders of bladder: Secondary | ICD-10-CM | POA: Diagnosis not present

## 2020-02-14 DIAGNOSIS — K760 Fatty (change of) liver, not elsewhere classified: Secondary | ICD-10-CM | POA: Diagnosis not present

## 2020-02-14 DIAGNOSIS — R1032 Left lower quadrant pain: Secondary | ICD-10-CM | POA: Diagnosis not present

## 2020-02-14 DIAGNOSIS — Z90710 Acquired absence of both cervix and uterus: Secondary | ICD-10-CM | POA: Diagnosis not present

## 2020-02-14 DIAGNOSIS — K3189 Other diseases of stomach and duodenum: Secondary | ICD-10-CM | POA: Diagnosis not present

## 2020-02-20 DIAGNOSIS — E1122 Type 2 diabetes mellitus with diabetic chronic kidney disease: Secondary | ICD-10-CM | POA: Diagnosis not present

## 2020-02-20 DIAGNOSIS — Z6841 Body Mass Index (BMI) 40.0 and over, adult: Secondary | ICD-10-CM | POA: Diagnosis not present

## 2020-02-20 DIAGNOSIS — I10 Essential (primary) hypertension: Secondary | ICD-10-CM | POA: Diagnosis not present

## 2020-02-20 DIAGNOSIS — E1165 Type 2 diabetes mellitus with hyperglycemia: Secondary | ICD-10-CM | POA: Diagnosis not present

## 2020-02-20 DIAGNOSIS — N183 Chronic kidney disease, stage 3 unspecified: Secondary | ICD-10-CM | POA: Diagnosis not present

## 2020-02-20 DIAGNOSIS — Z299 Encounter for prophylactic measures, unspecified: Secondary | ICD-10-CM | POA: Diagnosis not present

## 2020-03-04 DIAGNOSIS — I10 Essential (primary) hypertension: Secondary | ICD-10-CM | POA: Diagnosis not present

## 2020-04-03 DIAGNOSIS — M159 Polyosteoarthritis, unspecified: Secondary | ICD-10-CM | POA: Diagnosis not present

## 2020-04-03 DIAGNOSIS — I10 Essential (primary) hypertension: Secondary | ICD-10-CM | POA: Diagnosis not present

## 2020-05-03 DIAGNOSIS — I10 Essential (primary) hypertension: Secondary | ICD-10-CM | POA: Diagnosis not present

## 2020-05-08 DIAGNOSIS — N183 Chronic kidney disease, stage 3 unspecified: Secondary | ICD-10-CM | POA: Diagnosis not present

## 2020-05-08 DIAGNOSIS — E1122 Type 2 diabetes mellitus with diabetic chronic kidney disease: Secondary | ICD-10-CM | POA: Diagnosis not present

## 2020-05-08 DIAGNOSIS — Z299 Encounter for prophylactic measures, unspecified: Secondary | ICD-10-CM | POA: Diagnosis not present

## 2020-05-08 DIAGNOSIS — E1165 Type 2 diabetes mellitus with hyperglycemia: Secondary | ICD-10-CM | POA: Diagnosis not present

## 2020-05-08 DIAGNOSIS — I10 Essential (primary) hypertension: Secondary | ICD-10-CM | POA: Diagnosis not present

## 2020-06-03 DIAGNOSIS — I10 Essential (primary) hypertension: Secondary | ICD-10-CM | POA: Diagnosis not present

## 2020-06-03 DIAGNOSIS — M159 Polyosteoarthritis, unspecified: Secondary | ICD-10-CM | POA: Diagnosis not present

## 2020-07-03 DIAGNOSIS — I10 Essential (primary) hypertension: Secondary | ICD-10-CM | POA: Diagnosis not present

## 2020-07-03 DIAGNOSIS — M159 Polyosteoarthritis, unspecified: Secondary | ICD-10-CM | POA: Diagnosis not present

## 2020-07-16 DIAGNOSIS — K317 Polyp of stomach and duodenum: Secondary | ICD-10-CM | POA: Diagnosis not present

## 2020-07-16 DIAGNOSIS — K219 Gastro-esophageal reflux disease without esophagitis: Secondary | ICD-10-CM | POA: Diagnosis not present

## 2020-07-16 DIAGNOSIS — Z86010 Personal history of colonic polyps: Secondary | ICD-10-CM | POA: Diagnosis not present

## 2020-07-16 DIAGNOSIS — K222 Esophageal obstruction: Secondary | ICD-10-CM | POA: Diagnosis not present

## 2020-07-16 DIAGNOSIS — D509 Iron deficiency anemia, unspecified: Secondary | ICD-10-CM | POA: Diagnosis not present

## 2020-07-16 DIAGNOSIS — Z800 Family history of malignant neoplasm of digestive organs: Secondary | ICD-10-CM | POA: Diagnosis not present

## 2020-08-04 DIAGNOSIS — I10 Essential (primary) hypertension: Secondary | ICD-10-CM | POA: Diagnosis not present

## 2020-08-14 DIAGNOSIS — N183 Chronic kidney disease, stage 3 unspecified: Secondary | ICD-10-CM | POA: Diagnosis not present

## 2020-08-14 DIAGNOSIS — E1122 Type 2 diabetes mellitus with diabetic chronic kidney disease: Secondary | ICD-10-CM | POA: Diagnosis not present

## 2020-08-14 DIAGNOSIS — E118 Type 2 diabetes mellitus with unspecified complications: Secondary | ICD-10-CM | POA: Diagnosis not present

## 2020-08-14 DIAGNOSIS — I10 Essential (primary) hypertension: Secondary | ICD-10-CM | POA: Diagnosis not present

## 2020-08-14 DIAGNOSIS — E1165 Type 2 diabetes mellitus with hyperglycemia: Secondary | ICD-10-CM | POA: Diagnosis not present

## 2020-08-14 DIAGNOSIS — Z299 Encounter for prophylactic measures, unspecified: Secondary | ICD-10-CM | POA: Diagnosis not present

## 2020-08-30 ENCOUNTER — Encounter (HOSPITAL_COMMUNITY): Payer: Self-pay

## 2020-08-30 ENCOUNTER — Other Ambulatory Visit: Payer: Self-pay

## 2020-08-30 ENCOUNTER — Emergency Department (HOSPITAL_COMMUNITY): Payer: Medicare Other

## 2020-08-30 ENCOUNTER — Emergency Department (HOSPITAL_COMMUNITY)
Admission: EM | Admit: 2020-08-30 | Discharge: 2020-08-30 | Disposition: A | Discharge status: Home / Self Care | Payer: Medicare Other | Attending: Emergency Medicine | Admitting: Emergency Medicine

## 2020-08-30 DIAGNOSIS — Z9049 Acquired absence of other specified parts of digestive tract: Secondary | ICD-10-CM | POA: Insufficient documentation

## 2020-08-30 DIAGNOSIS — Z7982 Long term (current) use of aspirin: Secondary | ICD-10-CM | POA: Insufficient documentation

## 2020-08-30 DIAGNOSIS — R109 Unspecified abdominal pain: Secondary | ICD-10-CM | POA: Diagnosis not present

## 2020-08-30 DIAGNOSIS — M47817 Spondylosis without myelopathy or radiculopathy, lumbosacral region: Secondary | ICD-10-CM | POA: Diagnosis not present

## 2020-08-30 DIAGNOSIS — I10 Essential (primary) hypertension: Secondary | ICD-10-CM | POA: Insufficient documentation

## 2020-08-30 DIAGNOSIS — Z79899 Other long term (current) drug therapy: Secondary | ICD-10-CM | POA: Diagnosis not present

## 2020-08-30 DIAGNOSIS — R1030 Lower abdominal pain, unspecified: Secondary | ICD-10-CM | POA: Diagnosis not present

## 2020-08-30 DIAGNOSIS — M47816 Spondylosis without myelopathy or radiculopathy, lumbar region: Secondary | ICD-10-CM | POA: Diagnosis not present

## 2020-08-30 DIAGNOSIS — R102 Pelvic and perineal pain: Secondary | ICD-10-CM | POA: Diagnosis not present

## 2020-08-30 HISTORY — DX: Essential (primary) hypertension: I10

## 2020-08-30 HISTORY — DX: Unspecified osteoarthritis, unspecified site: M19.90

## 2020-08-30 LAB — URINALYSIS, ROUTINE W REFLEX MICROSCOPIC
Bilirubin Urine: NEGATIVE
Glucose, UA: NEGATIVE mg/dL
Hgb urine dipstick: NEGATIVE
Ketones, ur: NEGATIVE mg/dL
Leukocytes,Ua: NEGATIVE
Nitrite: NEGATIVE
Protein, ur: NEGATIVE mg/dL
Specific Gravity, Urine: 1.018 (ref 1.005–1.030)
pH: 7 (ref 5.0–8.0)

## 2020-08-30 LAB — CBC WITH DIFFERENTIAL/PLATELET
Abs Immature Granulocytes: 0.03 10*3/uL (ref 0.00–0.07)
Basophils Absolute: 0 10*3/uL (ref 0.0–0.1)
Basophils Relative: 0 %
Eosinophils Absolute: 0.1 10*3/uL (ref 0.0–0.5)
Eosinophils Relative: 2 %
HCT: 35.5 % — ABNORMAL LOW (ref 36.0–46.0)
Hemoglobin: 11 g/dL — ABNORMAL LOW (ref 12.0–15.0)
Immature Granulocytes: 1 %
Lymphocytes Relative: 26 %
Lymphs Abs: 1.5 10*3/uL (ref 0.7–4.0)
MCH: 25.9 pg — ABNORMAL LOW (ref 26.0–34.0)
MCHC: 31 g/dL (ref 30.0–36.0)
MCV: 83.7 fL (ref 80.0–100.0)
Monocytes Absolute: 0.4 10*3/uL (ref 0.1–1.0)
Monocytes Relative: 7 %
Neutro Abs: 3.7 10*3/uL (ref 1.7–7.7)
Neutrophils Relative %: 64 %
Platelets: 285 10*3/uL (ref 150–400)
RBC: 4.24 MIL/uL (ref 3.87–5.11)
RDW: 14.7 % (ref 11.5–15.5)
WBC: 5.7 10*3/uL (ref 4.0–10.5)
nRBC: 0 % (ref 0.0–0.2)

## 2020-08-30 LAB — COMPREHENSIVE METABOLIC PANEL
ALT: 11 U/L (ref 0–44)
AST: 17 U/L (ref 15–41)
Albumin: 3.9 g/dL (ref 3.5–5.0)
Alkaline Phosphatase: 100 U/L (ref 38–126)
Anion gap: 12 (ref 5–15)
BUN: 16 mg/dL (ref 8–23)
CO2: 28 mmol/L (ref 22–32)
Calcium: 9.5 mg/dL (ref 8.9–10.3)
Chloride: 101 mmol/L (ref 98–111)
Creatinine, Ser: 1.25 mg/dL — ABNORMAL HIGH (ref 0.44–1.00)
GFR, Estimated: 46 mL/min — ABNORMAL LOW (ref >60)
Glucose, Bld: 95 mg/dL (ref 70–99)
Potassium: 4 mmol/L (ref 3.5–5.1)
Sodium: 141 mmol/L (ref 135–145)
Total Bilirubin: 0.9 mg/dL (ref 0.3–1.2)
Total Protein: 7.8 g/dL (ref 6.5–8.1)

## 2020-08-30 LAB — PROTIME-INR
INR: 1 (ref 0.8–1.2)
Prothrombin Time: 12.5 seconds (ref 11.4–15.2)

## 2020-08-30 MED ORDER — DICYCLOMINE HCL 20 MG PO TABS: 20.0000 mg | ORAL_TABLET | Freq: Two times a day (BID) | ORAL | 0 refills | Status: AC

## 2020-08-30 MED ORDER — SENNOSIDES-DOCUSATE SODIUM 8.6-50 MG PO TABS: 2.0000 | ORAL_TABLET | Freq: Every day | ORAL | 0 refills | Status: AC

## 2020-08-30 NOTE — ED Provider Notes (Signed)
[facility] DEPT Provider Note   CSN: [id number] Arrival date & time: 08/30/20  1419     History Chief Complaint  Patient presents with  . Flank Pain    Brenda Greene is a 70 y.o. female.  HPI Patient presents with her daughter who assists with history. Patient was well until about 4 days ago.  Since that time she has had pain in the right flank, right inguinal area, right lateral abdomen.  No associated nausea, vomiting, diarrhea or bowel movement changes, no dysuria, polyuria, though she does note perhaps increased sense of urgency upon awakening each day. No medication taken for relief, pain is moderate, though worse with activity. She has a history of multiple abdominal surgeries including cholecystectomy, appendectomy, partial hysterectomy.  She has no history of kidney stones. She notes last colonoscopy was about 1 year ago, endoscopy about the same, and she has a history of dysplastic polyps.    Past Medical History:  Diagnosis Date  . Arthritis   . Hypertension     There are no problems to display for this patient.   Past Surgical History:  Procedure Laterality Date  . APPENDECTOMY    . BACK SURGERY    . BREAST SURGERY    . EYE MUSCLE SURGERY    . GALLBLADDER SURGERY    . PARTIAL HYSTERECTOMY       OB History   No obstetric history on file.     History reviewed. No pertinent family history.  Social History   Tobacco Use  . Smoking status: Never Smoker  . Smokeless tobacco: Never Used    Home Medications Prior to Admission medications   Medication Sig Start Date End Date Taking? Authorizing Provider  aspirin EC 81 MG tablet Take 81 mg by mouth daily. Swallow whole.   Yes [provider]  atorvastatin (LIPITOR) 10 MG tablet Take 10 mg by mouth daily.   Yes [provider]  calcium citrate-vitamin D (CITRACAL+D) 315-200 MG-UNIT tablet Take 1 tablet by mouth daily.   Yes [provider]   carvedilol (COREG) 25 MG tablet Take 25 mg by mouth 2 (two) times daily with a meal.   Yes [provider]  DULoxetine (CYMBALTA) 30 MG capsule Take 30 mg by mouth daily.   Yes [provider]  hydrochlorothiazide (HYDRODIURIL) 12.5 MG tablet Take 12.5 mg by mouth daily.   Yes [provider]  losartan-hydrochlorothiazide (HYZAAR) 100-12.5 MG tablet Take 1 tablet by mouth daily.   Yes [provider]  metFORMIN (GLUCOPHAGE) 500 MG tablet Take 500 mg by mouth daily.   Yes [provider]  omeprazole (PRILOSEC) 20 MG capsule Take 20 mg by mouth daily.   Yes [provider]  tiZANidine (ZANAFLEX) 4 MG tablet Take 4 mg by mouth in the morning and at bedtime.   Yes [provider]  traMADol (ULTRAM) 50 MG tablet Take 50 mg by mouth 3 (three) times daily as needed for moderate pain.   Yes [provider]  vitamin B-12 (CYANOCOBALAMIN) 1000 MCG tablet Take 1,000 mcg by mouth daily.   Yes [provider]    Allergies    Patient has no known allergies.  Review of Systems   Review of Systems  Physical Exam Updated Vital Signs BP (!) 155/74   Pulse (!) 59   Temp 97.7 F (36.5 C) (Oral)   Resp 15   SpO2 99%   Physical Exam  ED Results / Procedures / Treatments  Labs (all labs ordered are listed, but only abnormal results are displayed) Labs Reviewed  COMPREHENSIVE METABOLIC PANEL - Abnormal; Notable for the following components:      Result Value   Creatinine, Ser 1.25 (*)    GFR, Estimated 46 (*)    All other components within normal limits  CBC WITH DIFFERENTIAL/PLATELET - Abnormal; Notable for the following components:   Hemoglobin 11.0 (*)    HCT 35.5 (*)    MCH 25.9 (*)    All other components within normal limits  PROTIME-INR  URINALYSIS, ROUTINE W REFLEX MICROSCOPIC    EKG None  Radiology CT Renal Stone Study  Result Date: 08/30/2020 CLINICAL DATA:  70 year old with right flank pain and  inguinal pain. Evaluate for kidney stone. EXAM: CT ABDOMEN AND PELVIS WITHOUT CONTRAST TECHNIQUE: Multidetector CT imaging of the abdomen and pelvis was performed following the standard protocol without IV contrast. COMPARISON:  CT abdomen and pelvis 02/14/2020 FINDINGS: Lower chest: Lung bases are clear. Hepatobiliary: Cholecystectomy. Normal appearance of the liver. No significant biliary dilatation. Pancreas: Unremarkable. No pancreatic ductal dilatation or surrounding inflammatory changes. Spleen: Normal in size without focal abnormality. Adrenals/Urinary Tract: Normal adrenal glands. Urinary bladder is decompressed. Negative for kidney or ureter stone. Negative for hydronephrosis. No suspicious renal lesion. Stomach/Bowel: Stomach is within normal limits. No evidence for bowel obstruction or focal bowel inflammation. There is probably a small appendix without surrounding inflammatory changes. Vascular/Lymphatic: No significant vascular findings are present. No enlarged abdominal or pelvic lymph nodes. Reproductive: Status post hysterectomy. No adnexal masses. Other: Negative for ascites. Negative for free air. No evidence for an inguinal hernia. Musculoskeletal: Degenerative changes in the facet joints particularly at L4-L5 and L5-S1. Foraminal narrowing at L4-L5 and L5-S1. Vacuum discs at L4-L5 and L5-S1. IMPRESSION: 1. No acute abnormality in the abdomen or pelvis. Specifically, no evidence for kidney stones or hydronephrosis. 2. Degenerative changes in the lower lumbar spine. Electronically Signed   By: Markus Daft M.D.   On: 08/30/2020 15:58    Procedures Procedures 4:56 PM   Medications Ordered in ED Medications - No data to display  ED Course  I have reviewed the triage vital signs and the nursing notes.  Pertinent labs & imaging results that were available during my care of the patient were reviewed by me and considered in my medical decision making (see chart for details).    MDM  Rules/Calculators/A&P                          4:56 PM On repeat exam patient is awake, alert, resting comfortably.  She remains hemodynamically unremarkable. She, her niece and I discussed labs, which are reassuring aside from mild dehydration, and a CT scan which does not demonstrate stone, mass, obstruction, but does have some suggestion for constipation with substantial stool burden. Given the absence of evidence for peritonitis, bacteremia, sepsis, generally reassuring labs, CT as above, patient is appropriate for discharge, with outpatient GI follow-up. Patient requests referral to a local gastroenterologist and she has previously seen one in [place]. This will be accommodated. Patient will start a stool softener regimen, follow-up as an outpatient. Final Clinical Impression(s) / ED Diagnoses Final diagnoses:  Right flank pain    Rx / DC Orders ED Discharge Orders         Ordered    senna-docusate (SENOKOT-S) 8.6-50 MG tablet  Daily        08/30/20 1659    dicyclomine (BENTYL) 20  MG tablet  2 times daily        08/30/20 1659           Carmin Muskrat, MD 08/30/20 1659

## 2020-08-30 NOTE — ED Notes (Signed)
Patient transported to CT 

## 2020-08-30 NOTE — ED Triage Notes (Addendum)
Pt arrived via walk in, c/o right sided flank pain, radiating to RLQ abd pain x3 days. Denies any urinary sx, n/v or diarrhea. Denies any sick contacts.

## 2020-08-30 NOTE — Discharge Instructions (Signed)
As discussed, your evaluation today has been largely reassuring.  But, it is important that you monitor your condition carefully, and do not hesitate to return to the ED if you develop new, or concerning changes in your condition. ? ?Otherwise, please follow-up with your physician for appropriate ongoing care. ? ?

## 2020-09-01 DIAGNOSIS — I10 Essential (primary) hypertension: Secondary | ICD-10-CM | POA: Diagnosis not present

## 2020-10-02 DIAGNOSIS — I10 Essential (primary) hypertension: Secondary | ICD-10-CM | POA: Diagnosis not present

## 2020-10-31 DIAGNOSIS — I10 Essential (primary) hypertension: Secondary | ICD-10-CM | POA: Diagnosis not present

## 2020-11-24 DIAGNOSIS — Z79899 Other long term (current) drug therapy: Secondary | ICD-10-CM | POA: Diagnosis not present

## 2020-11-24 DIAGNOSIS — Z0000 Encounter for general adult medical examination without abnormal findings: Secondary | ICD-10-CM | POA: Diagnosis not present

## 2020-11-24 DIAGNOSIS — I10 Essential (primary) hypertension: Secondary | ICD-10-CM | POA: Diagnosis not present

## 2020-11-24 DIAGNOSIS — R5383 Other fatigue: Secondary | ICD-10-CM | POA: Diagnosis not present

## 2020-11-24 DIAGNOSIS — E1122 Type 2 diabetes mellitus with diabetic chronic kidney disease: Secondary | ICD-10-CM | POA: Diagnosis not present

## 2020-11-24 DIAGNOSIS — Z7189 Other specified counseling: Secondary | ICD-10-CM | POA: Diagnosis not present

## 2020-11-24 DIAGNOSIS — Z1331 Encounter for screening for depression: Secondary | ICD-10-CM | POA: Diagnosis not present

## 2020-11-24 DIAGNOSIS — Z299 Encounter for prophylactic measures, unspecified: Secondary | ICD-10-CM | POA: Diagnosis not present

## 2020-11-24 DIAGNOSIS — Z1339 Encounter for screening examination for other mental health and behavioral disorders: Secondary | ICD-10-CM | POA: Diagnosis not present

## 2020-11-24 DIAGNOSIS — Z6841 Body Mass Index (BMI) 40.0 and over, adult: Secondary | ICD-10-CM | POA: Diagnosis not present

## 2020-11-24 DIAGNOSIS — E7800 Pure hypercholesterolemia, unspecified: Secondary | ICD-10-CM | POA: Diagnosis not present

## 2020-12-02 DIAGNOSIS — I10 Essential (primary) hypertension: Secondary | ICD-10-CM | POA: Diagnosis not present

## 2021-01-01 DIAGNOSIS — I10 Essential (primary) hypertension: Secondary | ICD-10-CM | POA: Diagnosis not present

## 2021-01-09 DIAGNOSIS — Z1231 Encounter for screening mammogram for malignant neoplasm of breast: Secondary | ICD-10-CM | POA: Diagnosis not present

## 2021-01-13 DIAGNOSIS — Z23 Encounter for immunization: Secondary | ICD-10-CM | POA: Diagnosis not present

## 2021-01-30 DIAGNOSIS — I10 Essential (primary) hypertension: Secondary | ICD-10-CM | POA: Diagnosis not present

## 2021-02-12 DIAGNOSIS — H903 Sensorineural hearing loss, bilateral: Secondary | ICD-10-CM | POA: Diagnosis not present

## 2021-03-02 DIAGNOSIS — N183 Chronic kidney disease, stage 3 unspecified: Secondary | ICD-10-CM | POA: Diagnosis not present

## 2021-03-02 DIAGNOSIS — E1122 Type 2 diabetes mellitus with diabetic chronic kidney disease: Secondary | ICD-10-CM | POA: Diagnosis not present

## 2021-03-02 DIAGNOSIS — E1165 Type 2 diabetes mellitus with hyperglycemia: Secondary | ICD-10-CM | POA: Diagnosis not present

## 2021-03-02 DIAGNOSIS — I10 Essential (primary) hypertension: Secondary | ICD-10-CM | POA: Diagnosis not present

## 2021-03-02 DIAGNOSIS — Z299 Encounter for prophylactic measures, unspecified: Secondary | ICD-10-CM | POA: Diagnosis not present

## 2021-03-04 DIAGNOSIS — I10 Essential (primary) hypertension: Secondary | ICD-10-CM | POA: Diagnosis not present

## 2021-03-13 DIAGNOSIS — I10 Essential (primary) hypertension: Secondary | ICD-10-CM | POA: Diagnosis not present

## 2021-03-13 DIAGNOSIS — M25561 Pain in right knee: Secondary | ICD-10-CM | POA: Diagnosis not present

## 2021-03-13 DIAGNOSIS — Z713 Dietary counseling and surveillance: Secondary | ICD-10-CM | POA: Diagnosis not present

## 2021-03-13 DIAGNOSIS — M170 Bilateral primary osteoarthritis of knee: Secondary | ICD-10-CM | POA: Diagnosis not present

## 2021-03-13 DIAGNOSIS — Z299 Encounter for prophylactic measures, unspecified: Secondary | ICD-10-CM | POA: Diagnosis not present

## 2021-03-13 DIAGNOSIS — Z6841 Body Mass Index (BMI) 40.0 and over, adult: Secondary | ICD-10-CM | POA: Diagnosis not present

## 2021-03-16 DIAGNOSIS — Z6841 Body Mass Index (BMI) 40.0 and over, adult: Secondary | ICD-10-CM | POA: Diagnosis not present

## 2021-03-16 DIAGNOSIS — I10 Essential (primary) hypertension: Secondary | ICD-10-CM | POA: Diagnosis not present

## 2021-03-16 DIAGNOSIS — Z299 Encounter for prophylactic measures, unspecified: Secondary | ICD-10-CM | POA: Diagnosis not present

## 2021-03-16 DIAGNOSIS — Z713 Dietary counseling and surveillance: Secondary | ICD-10-CM | POA: Diagnosis not present

## 2021-03-16 DIAGNOSIS — M1712 Unilateral primary osteoarthritis, left knee: Secondary | ICD-10-CM | POA: Diagnosis not present

## 2021-04-03 DIAGNOSIS — I10 Essential (primary) hypertension: Secondary | ICD-10-CM | POA: Diagnosis not present

## 2021-04-20 DIAGNOSIS — M1711 Unilateral primary osteoarthritis, right knee: Secondary | ICD-10-CM | POA: Diagnosis not present

## 2021-04-24 DIAGNOSIS — M1712 Unilateral primary osteoarthritis, left knee: Secondary | ICD-10-CM | POA: Diagnosis not present

## 2021-04-27 DIAGNOSIS — M1711 Unilateral primary osteoarthritis, right knee: Secondary | ICD-10-CM | POA: Diagnosis not present

## 2021-04-30 DIAGNOSIS — M1712 Unilateral primary osteoarthritis, left knee: Secondary | ICD-10-CM | POA: Diagnosis not present

## 2021-05-04 DIAGNOSIS — I10 Essential (primary) hypertension: Secondary | ICD-10-CM | POA: Diagnosis not present

## 2021-05-04 DIAGNOSIS — M1711 Unilateral primary osteoarthritis, right knee: Secondary | ICD-10-CM | POA: Diagnosis not present

## 2021-05-08 DIAGNOSIS — M1712 Unilateral primary osteoarthritis, left knee: Secondary | ICD-10-CM | POA: Diagnosis not present

## 2021-05-11 DIAGNOSIS — M1712 Unilateral primary osteoarthritis, left knee: Secondary | ICD-10-CM | POA: Diagnosis not present

## 2021-05-14 DIAGNOSIS — M1712 Unilateral primary osteoarthritis, left knee: Secondary | ICD-10-CM | POA: Diagnosis not present

## 2021-05-18 DIAGNOSIS — M1711 Unilateral primary osteoarthritis, right knee: Secondary | ICD-10-CM | POA: Diagnosis not present

## 2021-05-22 DIAGNOSIS — M1712 Unilateral primary osteoarthritis, left knee: Secondary | ICD-10-CM | POA: Diagnosis not present

## 2021-05-25 DIAGNOSIS — M1711 Unilateral primary osteoarthritis, right knee: Secondary | ICD-10-CM | POA: Diagnosis not present

## 2021-06-01 DIAGNOSIS — M1712 Unilateral primary osteoarthritis, left knee: Secondary | ICD-10-CM | POA: Diagnosis not present

## 2021-06-03 DIAGNOSIS — I10 Essential (primary) hypertension: Secondary | ICD-10-CM | POA: Diagnosis not present

## 2021-06-29 DIAGNOSIS — J101 Influenza due to other identified influenza virus with other respiratory manifestations: Secondary | ICD-10-CM | POA: Diagnosis not present

## 2021-06-29 DIAGNOSIS — U071 COVID-19: Secondary | ICD-10-CM | POA: Diagnosis not present

## 2021-06-29 DIAGNOSIS — Z20822 Contact with and (suspected) exposure to covid-19: Secondary | ICD-10-CM | POA: Diagnosis not present

## 2021-07-20 DIAGNOSIS — K635 Polyp of colon: Secondary | ICD-10-CM | POA: Diagnosis not present

## 2021-07-20 DIAGNOSIS — Z800 Family history of malignant neoplasm of digestive organs: Secondary | ICD-10-CM | POA: Diagnosis not present

## 2021-07-20 DIAGNOSIS — K3189 Other diseases of stomach and duodenum: Secondary | ICD-10-CM | POA: Diagnosis not present

## 2021-07-20 DIAGNOSIS — K2970 Gastritis, unspecified, without bleeding: Secondary | ICD-10-CM | POA: Diagnosis not present

## 2021-07-20 DIAGNOSIS — K219 Gastro-esophageal reflux disease without esophagitis: Secondary | ICD-10-CM | POA: Diagnosis not present

## 2021-07-20 DIAGNOSIS — E119 Type 2 diabetes mellitus without complications: Secondary | ICD-10-CM | POA: Diagnosis not present

## 2021-07-20 DIAGNOSIS — Z86010 Personal history of colonic polyps: Secondary | ICD-10-CM | POA: Diagnosis not present

## 2021-07-20 DIAGNOSIS — K317 Polyp of stomach and duodenum: Secondary | ICD-10-CM | POA: Diagnosis not present

## 2021-07-20 DIAGNOSIS — Z1211 Encounter for screening for malignant neoplasm of colon: Secondary | ICD-10-CM | POA: Diagnosis not present

## 2021-07-30 DIAGNOSIS — E1122 Type 2 diabetes mellitus with diabetic chronic kidney disease: Secondary | ICD-10-CM | POA: Diagnosis not present

## 2021-07-30 DIAGNOSIS — E1165 Type 2 diabetes mellitus with hyperglycemia: Secondary | ICD-10-CM | POA: Diagnosis not present

## 2021-07-30 DIAGNOSIS — Z299 Encounter for prophylactic measures, unspecified: Secondary | ICD-10-CM | POA: Diagnosis not present

## 2021-07-30 DIAGNOSIS — I10 Essential (primary) hypertension: Secondary | ICD-10-CM | POA: Diagnosis not present

## 2021-07-30 DIAGNOSIS — Z6841 Body Mass Index (BMI) 40.0 and over, adult: Secondary | ICD-10-CM | POA: Diagnosis not present

## 2021-07-30 DIAGNOSIS — N183 Chronic kidney disease, stage 3 unspecified: Secondary | ICD-10-CM | POA: Diagnosis not present

## 2021-09-13 IMAGING — CT CT RENAL STONE PROTOCOL
2 of 4 series · 16 of 46 positions shown, 18 images · non-contrast
Comparison: CT abdomen and pelvis 02/14/2020

CLINICAL DATA: 69-year-old with right flank pain and inguinal pain.
Evaluate for kidney stone.

EXAM:
CT ABDOMEN AND PELVIS WITHOUT CONTRAST
TECHNIQUE: Multidetector CT imaging of the abdomen and pelvis was performed
following the standard protocol without IV contrast.

[Series 2: axial st · axial · 0.79mm/px · z∈[-427,-52]mm · 13 of 86 slices shown, 15 images]
[im 6/86  soft-tissue]
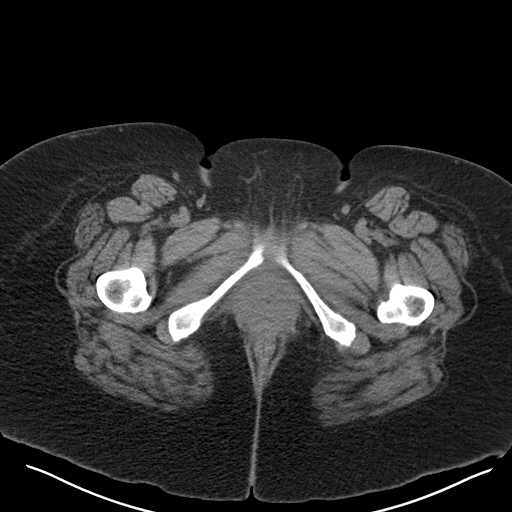
[im 6/86  bone]
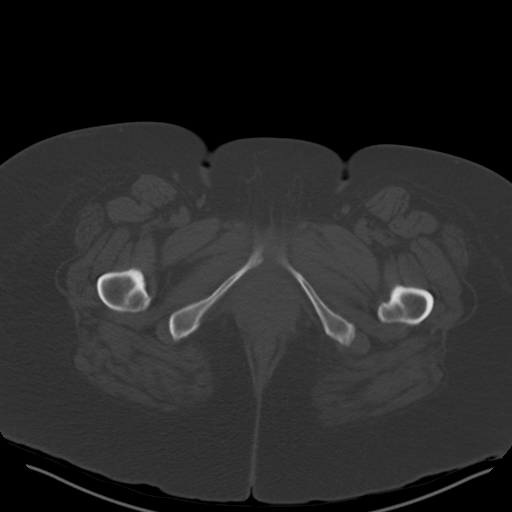
[im 11/86  soft-tissue]
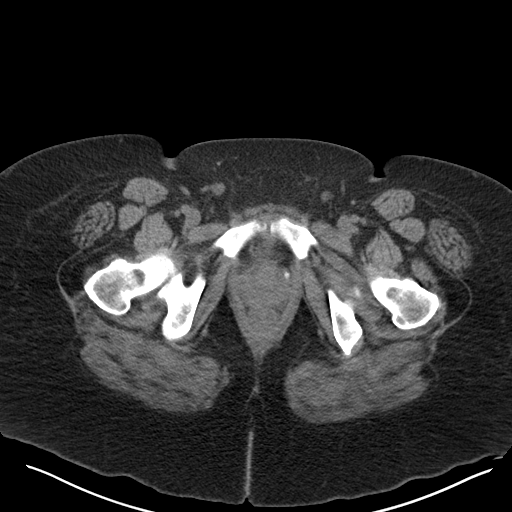
[im 21/86  soft-tissue]
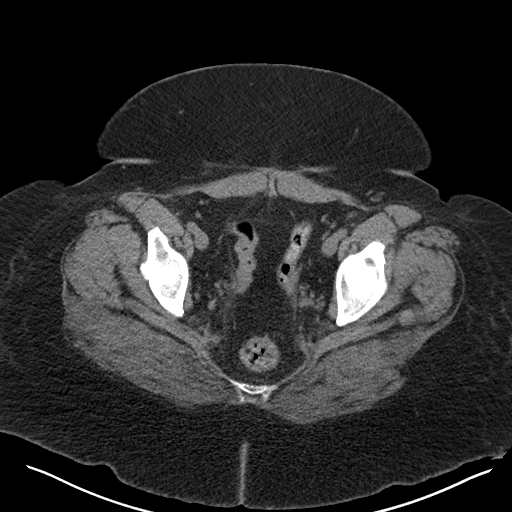
[im 26/86  soft-tissue]
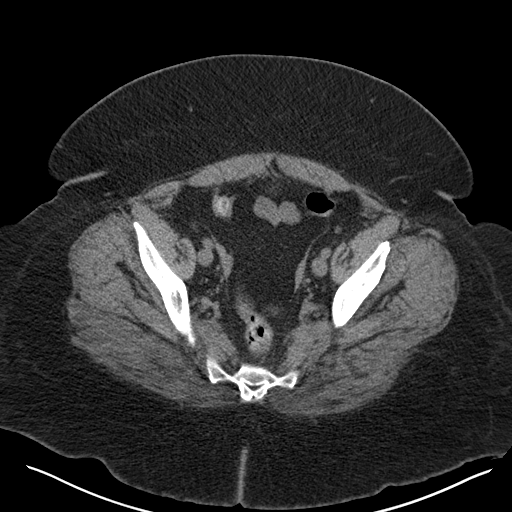
[im 31/86  soft-tissue]
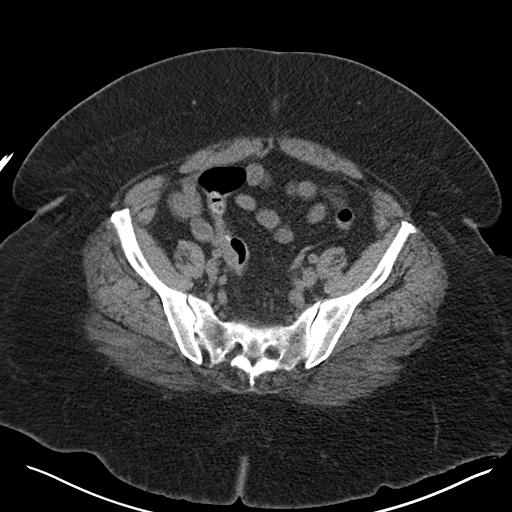
[im 36/86  soft-tissue]
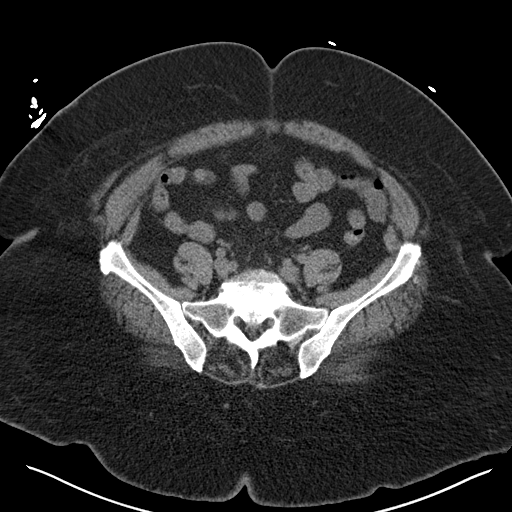
[im 46/86  soft-tissue]
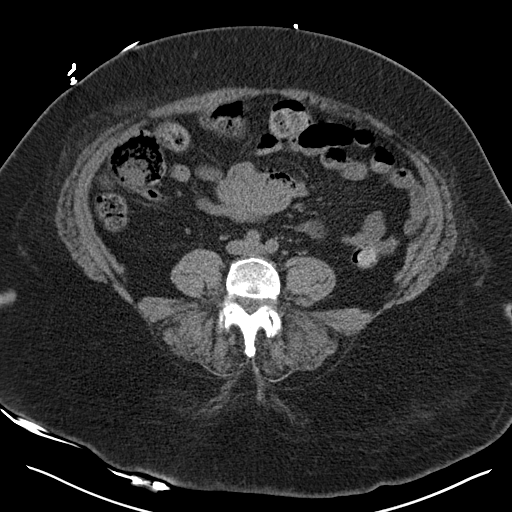
[im 51/86  soft-tissue]
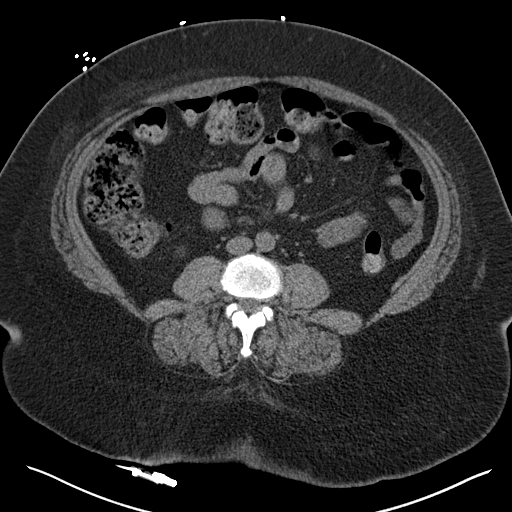
[im 56/86  soft-tissue]
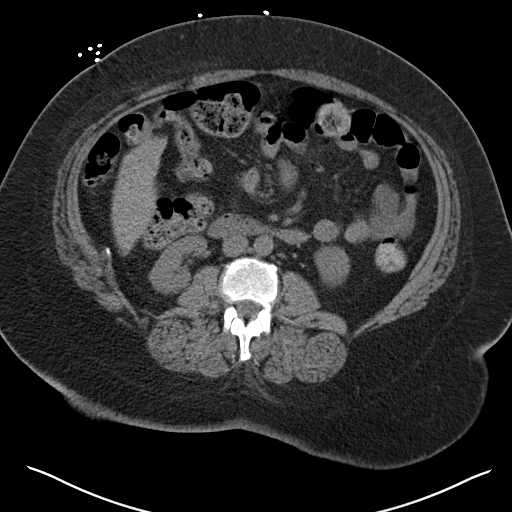
[im 56/86  bone]
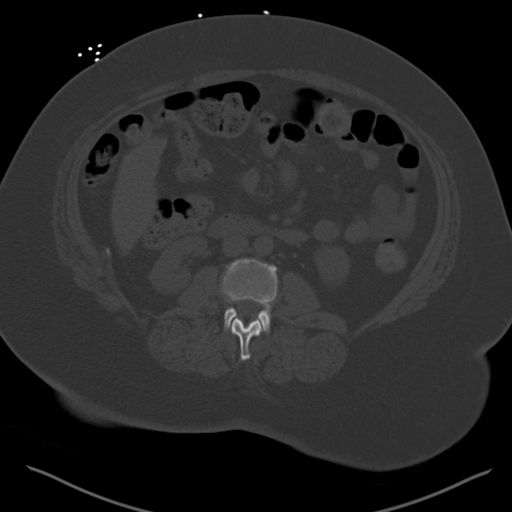
[im 61/86  soft-tissue]
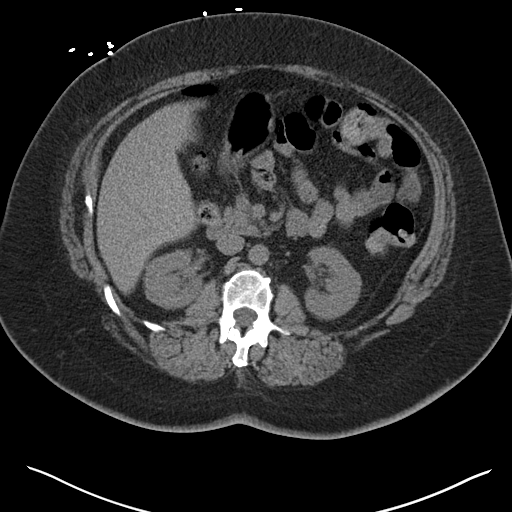
[im 66/86  soft-tissue]
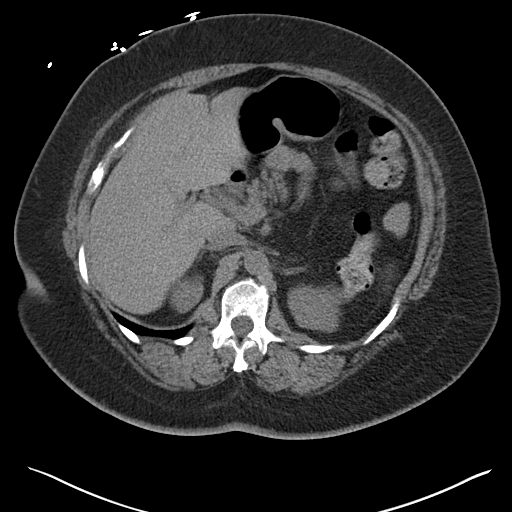
[im 76/86  soft-tissue]
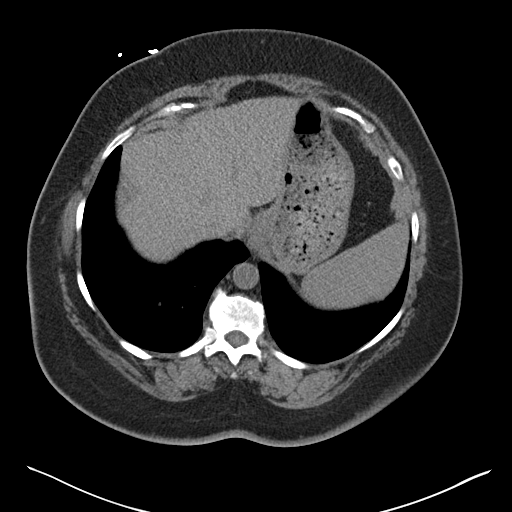
[im 81/86  soft-tissue]
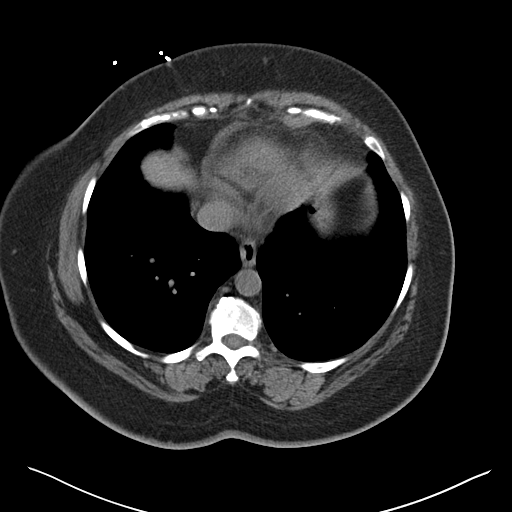

[Series 5: coronal · coronal · 0.72mm/px · 3 of 180 slices shown]
[im 60/180  soft-tissue]
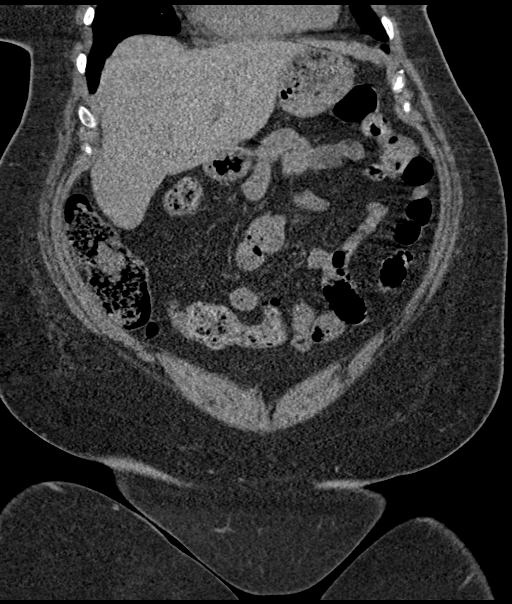
[im 80/180  soft-tissue]
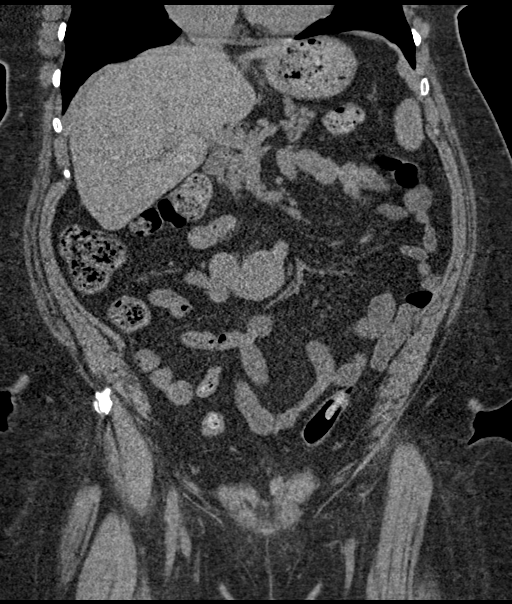
[im 100/180  soft-tissue]
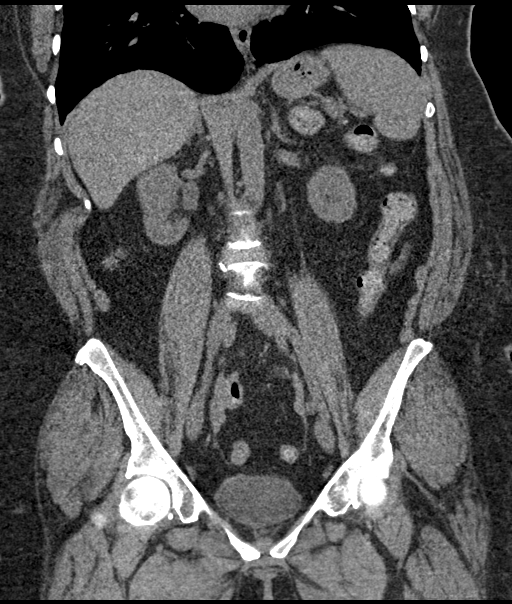

[16 of 46 positions shown; findings below may reference images not displayed]

FINDINGS: Lower chest: Lung bases are clear.

Hepatobiliary: Cholecystectomy. Normal appearance of the liver. No
significant biliary dilatation.

Pancreas: Unremarkable. No pancreatic ductal dilatation or
surrounding inflammatory changes.

Spleen: Normal in size without focal abnormality.

Adrenals/Urinary Tract: Normal adrenal glands. Urinary bladder is
decompressed. Negative for kidney or ureter stone. Negative for
hydronephrosis. No suspicious renal lesion.

Stomach/Bowel: Stomach is within normal limits. No evidence for
bowel obstruction or focal bowel inflammation. There is probably a
small appendix without surrounding inflammatory changes.

Vascular/Lymphatic: No significant vascular findings are present. No
enlarged abdominal or pelvic lymph nodes.

Reproductive: Status post hysterectomy. No adnexal masses.

Other: Negative for ascites. Negative for free air. No evidence for
an inguinal hernia.

Musculoskeletal: Degenerative changes in the facet joints
particularly at L4-L5 and L5-S1. Foraminal narrowing at L4-L5 and
L5-S1. Vacuum discs at L4-L5 and L5-S1.
IMPRESSION: 1. No acute abnormality in the abdomen or pelvis. Specifically, no
evidence for kidney stones or hydronephrosis.
2. Degenerative changes in the lower lumbar spine.

## 2021-11-04 DIAGNOSIS — I10 Essential (primary) hypertension: Secondary | ICD-10-CM | POA: Diagnosis not present

## 2021-11-04 DIAGNOSIS — Z299 Encounter for prophylactic measures, unspecified: Secondary | ICD-10-CM | POA: Diagnosis not present

## 2021-11-04 DIAGNOSIS — Z789 Other specified health status: Secondary | ICD-10-CM | POA: Diagnosis not present

## 2021-11-04 DIAGNOSIS — E1122 Type 2 diabetes mellitus with diabetic chronic kidney disease: Secondary | ICD-10-CM | POA: Diagnosis not present

## 2021-11-04 DIAGNOSIS — M545 Low back pain, unspecified: Secondary | ICD-10-CM | POA: Diagnosis not present

## 2021-11-04 DIAGNOSIS — E1165 Type 2 diabetes mellitus with hyperglycemia: Secondary | ICD-10-CM | POA: Diagnosis not present

## 2021-11-21 DIAGNOSIS — J069 Acute upper respiratory infection, unspecified: Secondary | ICD-10-CM | POA: Diagnosis not present

## 2021-12-01 DIAGNOSIS — Z79899 Other long term (current) drug therapy: Secondary | ICD-10-CM | POA: Diagnosis not present

## 2021-12-01 DIAGNOSIS — I10 Essential (primary) hypertension: Secondary | ICD-10-CM | POA: Diagnosis not present

## 2021-12-01 DIAGNOSIS — Z6841 Body Mass Index (BMI) 40.0 and over, adult: Secondary | ICD-10-CM | POA: Diagnosis not present

## 2021-12-01 DIAGNOSIS — Z299 Encounter for prophylactic measures, unspecified: Secondary | ICD-10-CM | POA: Diagnosis not present

## 2021-12-01 DIAGNOSIS — E7800 Pure hypercholesterolemia, unspecified: Secondary | ICD-10-CM | POA: Diagnosis not present

## 2021-12-01 DIAGNOSIS — Z1339 Encounter for screening examination for other mental health and behavioral disorders: Secondary | ICD-10-CM | POA: Diagnosis not present

## 2021-12-01 DIAGNOSIS — R5383 Other fatigue: Secondary | ICD-10-CM | POA: Diagnosis not present

## 2021-12-01 DIAGNOSIS — Z7189 Other specified counseling: Secondary | ICD-10-CM | POA: Diagnosis not present

## 2021-12-01 DIAGNOSIS — Z1331 Encounter for screening for depression: Secondary | ICD-10-CM | POA: Diagnosis not present

## 2021-12-01 DIAGNOSIS — Z0000 Encounter for general adult medical examination without abnormal findings: Secondary | ICD-10-CM | POA: Diagnosis not present

## 2022-01-06 DIAGNOSIS — D509 Iron deficiency anemia, unspecified: Secondary | ICD-10-CM | POA: Diagnosis not present

## 2022-01-06 DIAGNOSIS — Z713 Dietary counseling and surveillance: Secondary | ICD-10-CM | POA: Diagnosis not present

## 2022-01-06 DIAGNOSIS — Z6841 Body Mass Index (BMI) 40.0 and over, adult: Secondary | ICD-10-CM | POA: Diagnosis not present

## 2022-01-06 DIAGNOSIS — I10 Essential (primary) hypertension: Secondary | ICD-10-CM | POA: Diagnosis not present

## 2022-01-06 DIAGNOSIS — Z299 Encounter for prophylactic measures, unspecified: Secondary | ICD-10-CM | POA: Diagnosis not present

## 2022-01-13 DIAGNOSIS — R5383 Other fatigue: Secondary | ICD-10-CM | POA: Diagnosis not present

## 2022-02-03 DIAGNOSIS — Z800 Family history of malignant neoplasm of digestive organs: Secondary | ICD-10-CM | POA: Diagnosis not present

## 2022-02-03 DIAGNOSIS — K222 Esophageal obstruction: Secondary | ICD-10-CM | POA: Diagnosis not present

## 2022-02-03 DIAGNOSIS — Z86010 Personal history of colonic polyps: Secondary | ICD-10-CM | POA: Diagnosis not present

## 2022-02-03 DIAGNOSIS — K5900 Constipation, unspecified: Secondary | ICD-10-CM | POA: Diagnosis not present

## 2022-02-03 DIAGNOSIS — K449 Diaphragmatic hernia without obstruction or gangrene: Secondary | ICD-10-CM | POA: Diagnosis not present

## 2022-02-22 DIAGNOSIS — E1165 Type 2 diabetes mellitus with hyperglycemia: Secondary | ICD-10-CM | POA: Diagnosis not present

## 2022-02-22 DIAGNOSIS — I10 Essential (primary) hypertension: Secondary | ICD-10-CM | POA: Diagnosis not present

## 2022-02-22 DIAGNOSIS — Z6841 Body Mass Index (BMI) 40.0 and over, adult: Secondary | ICD-10-CM | POA: Diagnosis not present

## 2022-02-22 DIAGNOSIS — N183 Chronic kidney disease, stage 3 unspecified: Secondary | ICD-10-CM | POA: Diagnosis not present

## 2022-02-22 DIAGNOSIS — Z299 Encounter for prophylactic measures, unspecified: Secondary | ICD-10-CM | POA: Diagnosis not present

## 2022-02-26 DIAGNOSIS — Z1231 Encounter for screening mammogram for malignant neoplasm of breast: Secondary | ICD-10-CM | POA: Diagnosis not present

## 2022-03-01 DIAGNOSIS — Z1211 Encounter for screening for malignant neoplasm of colon: Secondary | ICD-10-CM | POA: Diagnosis not present

## 2022-03-01 DIAGNOSIS — K635 Polyp of colon: Secondary | ICD-10-CM | POA: Diagnosis not present

## 2022-03-01 DIAGNOSIS — Z86010 Personal history of colonic polyps: Secondary | ICD-10-CM | POA: Diagnosis not present

## 2022-03-01 DIAGNOSIS — E119 Type 2 diabetes mellitus without complications: Secondary | ICD-10-CM | POA: Diagnosis not present

## 2022-03-02 DIAGNOSIS — Z1231 Encounter for screening mammogram for malignant neoplasm of breast: Secondary | ICD-10-CM | POA: Diagnosis not present

## 2022-03-22 DIAGNOSIS — Z1211 Encounter for screening for malignant neoplasm of colon: Secondary | ICD-10-CM | POA: Diagnosis not present

## 2022-03-22 DIAGNOSIS — K449 Diaphragmatic hernia without obstruction or gangrene: Secondary | ICD-10-CM | POA: Diagnosis not present

## 2022-03-22 DIAGNOSIS — K219 Gastro-esophageal reflux disease without esophagitis: Secondary | ICD-10-CM | POA: Diagnosis not present

## 2022-03-22 DIAGNOSIS — Z86010 Personal history of colonic polyps: Secondary | ICD-10-CM | POA: Diagnosis not present

## 2022-03-22 DIAGNOSIS — K5900 Constipation, unspecified: Secondary | ICD-10-CM | POA: Diagnosis not present

## 2022-03-22 DIAGNOSIS — Z800 Family history of malignant neoplasm of digestive organs: Secondary | ICD-10-CM | POA: Diagnosis not present

## 2022-04-22 DIAGNOSIS — S7002XD Contusion of left hip, subsequent encounter: Secondary | ICD-10-CM | POA: Diagnosis not present

## 2022-04-22 DIAGNOSIS — S39012D Strain of muscle, fascia and tendon of lower back, subsequent encounter: Secondary | ICD-10-CM | POA: Diagnosis not present

## 2022-05-04 DIAGNOSIS — M545 Low back pain, unspecified: Secondary | ICD-10-CM | POA: Diagnosis not present

## 2022-05-04 DIAGNOSIS — I10 Essential (primary) hypertension: Secondary | ICD-10-CM | POA: Diagnosis not present

## 2022-05-04 DIAGNOSIS — N1831 Chronic kidney disease, stage 3a: Secondary | ICD-10-CM | POA: Diagnosis not present

## 2022-05-04 DIAGNOSIS — E1122 Type 2 diabetes mellitus with diabetic chronic kidney disease: Secondary | ICD-10-CM | POA: Diagnosis not present

## 2022-06-08 DIAGNOSIS — I10 Essential (primary) hypertension: Secondary | ICD-10-CM | POA: Diagnosis not present

## 2022-06-08 DIAGNOSIS — M545 Low back pain, unspecified: Secondary | ICD-10-CM | POA: Diagnosis not present

## 2022-06-08 DIAGNOSIS — Z299 Encounter for prophylactic measures, unspecified: Secondary | ICD-10-CM | POA: Diagnosis not present

## 2022-06-08 DIAGNOSIS — E1165 Type 2 diabetes mellitus with hyperglycemia: Secondary | ICD-10-CM | POA: Diagnosis not present

## 2022-06-08 DIAGNOSIS — F3342 Major depressive disorder, recurrent, in full remission: Secondary | ICD-10-CM | POA: Diagnosis not present

## 2022-09-23 DIAGNOSIS — N183 Chronic kidney disease, stage 3 unspecified: Secondary | ICD-10-CM | POA: Diagnosis not present

## 2022-09-23 DIAGNOSIS — Z299 Encounter for prophylactic measures, unspecified: Secondary | ICD-10-CM | POA: Diagnosis not present

## 2022-09-23 DIAGNOSIS — I10 Essential (primary) hypertension: Secondary | ICD-10-CM | POA: Diagnosis not present

## 2022-09-23 DIAGNOSIS — E1122 Type 2 diabetes mellitus with diabetic chronic kidney disease: Secondary | ICD-10-CM | POA: Diagnosis not present

## 2022-09-23 DIAGNOSIS — E1165 Type 2 diabetes mellitus with hyperglycemia: Secondary | ICD-10-CM | POA: Diagnosis not present

## 2022-12-06 DIAGNOSIS — R5383 Other fatigue: Secondary | ICD-10-CM | POA: Diagnosis not present

## 2022-12-06 DIAGNOSIS — Z7189 Other specified counseling: Secondary | ICD-10-CM | POA: Diagnosis not present

## 2022-12-06 DIAGNOSIS — Z1331 Encounter for screening for depression: Secondary | ICD-10-CM | POA: Diagnosis not present

## 2022-12-06 DIAGNOSIS — Z0000 Encounter for general adult medical examination without abnormal findings: Secondary | ICD-10-CM | POA: Diagnosis not present

## 2022-12-06 DIAGNOSIS — Z299 Encounter for prophylactic measures, unspecified: Secondary | ICD-10-CM | POA: Diagnosis not present

## 2022-12-06 DIAGNOSIS — I10 Essential (primary) hypertension: Secondary | ICD-10-CM | POA: Diagnosis not present

## 2022-12-06 DIAGNOSIS — E7800 Pure hypercholesterolemia, unspecified: Secondary | ICD-10-CM | POA: Diagnosis not present

## 2022-12-06 DIAGNOSIS — Z79899 Other long term (current) drug therapy: Secondary | ICD-10-CM | POA: Diagnosis not present

## 2022-12-06 DIAGNOSIS — Z1339 Encounter for screening examination for other mental health and behavioral disorders: Secondary | ICD-10-CM | POA: Diagnosis not present

## 2023-01-10 DIAGNOSIS — N183 Chronic kidney disease, stage 3 unspecified: Secondary | ICD-10-CM | POA: Diagnosis not present

## 2023-01-10 DIAGNOSIS — M545 Low back pain, unspecified: Secondary | ICD-10-CM | POA: Diagnosis not present

## 2023-01-10 DIAGNOSIS — E1122 Type 2 diabetes mellitus with diabetic chronic kidney disease: Secondary | ICD-10-CM | POA: Diagnosis not present

## 2023-01-10 DIAGNOSIS — Z299 Encounter for prophylactic measures, unspecified: Secondary | ICD-10-CM | POA: Diagnosis not present

## 2023-01-10 DIAGNOSIS — I10 Essential (primary) hypertension: Secondary | ICD-10-CM | POA: Diagnosis not present

## 2023-01-10 DIAGNOSIS — E1165 Type 2 diabetes mellitus with hyperglycemia: Secondary | ICD-10-CM | POA: Diagnosis not present

## 2023-03-21 DIAGNOSIS — K5900 Constipation, unspecified: Secondary | ICD-10-CM | POA: Diagnosis not present

## 2023-03-21 DIAGNOSIS — Z86010 Personal history of colonic polyps: Secondary | ICD-10-CM | POA: Diagnosis not present

## 2023-03-21 DIAGNOSIS — D509 Iron deficiency anemia, unspecified: Secondary | ICD-10-CM | POA: Diagnosis not present

## 2023-03-21 DIAGNOSIS — K219 Gastro-esophageal reflux disease without esophagitis: Secondary | ICD-10-CM | POA: Diagnosis not present

## 2023-03-21 DIAGNOSIS — Z800 Family history of malignant neoplasm of digestive organs: Secondary | ICD-10-CM | POA: Diagnosis not present

## 2023-04-15 DIAGNOSIS — Z1231 Encounter for screening mammogram for malignant neoplasm of breast: Secondary | ICD-10-CM | POA: Diagnosis not present

## 2023-04-22 DIAGNOSIS — Z1231 Encounter for screening mammogram for malignant neoplasm of breast: Secondary | ICD-10-CM | POA: Diagnosis not present

## 2023-04-25 DIAGNOSIS — H35033 Hypertensive retinopathy, bilateral: Secondary | ICD-10-CM | POA: Diagnosis not present

## 2023-04-25 DIAGNOSIS — H25813 Combined forms of age-related cataract, bilateral: Secondary | ICD-10-CM | POA: Diagnosis not present

## 2023-04-25 DIAGNOSIS — H5052 Exophoria: Secondary | ICD-10-CM | POA: Diagnosis not present

## 2023-04-25 DIAGNOSIS — I10 Essential (primary) hypertension: Secondary | ICD-10-CM | POA: Diagnosis not present

## 2023-04-25 DIAGNOSIS — E119 Type 2 diabetes mellitus without complications: Secondary | ICD-10-CM | POA: Diagnosis not present

## 2023-05-02 DIAGNOSIS — N1831 Chronic kidney disease, stage 3a: Secondary | ICD-10-CM | POA: Diagnosis not present

## 2023-05-02 DIAGNOSIS — Z299 Encounter for prophylactic measures, unspecified: Secondary | ICD-10-CM | POA: Diagnosis not present

## 2023-05-02 DIAGNOSIS — M5416 Radiculopathy, lumbar region: Secondary | ICD-10-CM | POA: Diagnosis not present

## 2023-05-02 DIAGNOSIS — E1122 Type 2 diabetes mellitus with diabetic chronic kidney disease: Secondary | ICD-10-CM | POA: Diagnosis not present

## 2023-05-02 DIAGNOSIS — F3342 Major depressive disorder, recurrent, in full remission: Secondary | ICD-10-CM | POA: Diagnosis not present

## 2023-05-02 DIAGNOSIS — I10 Essential (primary) hypertension: Secondary | ICD-10-CM | POA: Diagnosis not present

## 2023-07-24 DIAGNOSIS — R519 Headache, unspecified: Secondary | ICD-10-CM | POA: Diagnosis not present

## 2023-07-24 DIAGNOSIS — Z139 Encounter for screening, unspecified: Secondary | ICD-10-CM | POA: Diagnosis not present

## 2023-07-24 DIAGNOSIS — M79603 Pain in arm, unspecified: Secondary | ICD-10-CM | POA: Diagnosis not present

## 2023-07-24 DIAGNOSIS — R030 Elevated blood-pressure reading, without diagnosis of hypertension: Secondary | ICD-10-CM | POA: Diagnosis not present

## 2023-07-24 DIAGNOSIS — Z0000 Encounter for general adult medical examination without abnormal findings: Secondary | ICD-10-CM | POA: Diagnosis not present

## 2023-09-01 DIAGNOSIS — N183 Chronic kidney disease, stage 3 unspecified: Secondary | ICD-10-CM | POA: Diagnosis not present

## 2023-09-01 DIAGNOSIS — Z299 Encounter for prophylactic measures, unspecified: Secondary | ICD-10-CM | POA: Diagnosis not present

## 2023-09-01 DIAGNOSIS — E1165 Type 2 diabetes mellitus with hyperglycemia: Secondary | ICD-10-CM | POA: Diagnosis not present

## 2023-09-01 DIAGNOSIS — E1122 Type 2 diabetes mellitus with diabetic chronic kidney disease: Secondary | ICD-10-CM | POA: Diagnosis not present

## 2023-09-01 DIAGNOSIS — I10 Essential (primary) hypertension: Secondary | ICD-10-CM | POA: Diagnosis not present

## 2023-09-01 DIAGNOSIS — M179 Osteoarthritis of knee, unspecified: Secondary | ICD-10-CM | POA: Diagnosis not present

## 2023-12-13 DIAGNOSIS — Z6841 Body Mass Index (BMI) 40.0 and over, adult: Secondary | ICD-10-CM | POA: Diagnosis not present

## 2023-12-13 DIAGNOSIS — Z1331 Encounter for screening for depression: Secondary | ICD-10-CM | POA: Diagnosis not present

## 2023-12-13 DIAGNOSIS — M179 Osteoarthritis of knee, unspecified: Secondary | ICD-10-CM | POA: Diagnosis not present

## 2023-12-13 DIAGNOSIS — Z0000 Encounter for general adult medical examination without abnormal findings: Secondary | ICD-10-CM | POA: Diagnosis not present

## 2023-12-13 DIAGNOSIS — Z7189 Other specified counseling: Secondary | ICD-10-CM | POA: Diagnosis not present

## 2023-12-13 DIAGNOSIS — Z79899 Other long term (current) drug therapy: Secondary | ICD-10-CM | POA: Diagnosis not present

## 2023-12-13 DIAGNOSIS — Z299 Encounter for prophylactic measures, unspecified: Secondary | ICD-10-CM | POA: Diagnosis not present

## 2023-12-13 DIAGNOSIS — Z1339 Encounter for screening examination for other mental health and behavioral disorders: Secondary | ICD-10-CM | POA: Diagnosis not present

## 2023-12-13 DIAGNOSIS — I10 Essential (primary) hypertension: Secondary | ICD-10-CM | POA: Diagnosis not present

## 2023-12-13 DIAGNOSIS — E1122 Type 2 diabetes mellitus with diabetic chronic kidney disease: Secondary | ICD-10-CM | POA: Diagnosis not present

## 2023-12-13 DIAGNOSIS — E7800 Pure hypercholesterolemia, unspecified: Secondary | ICD-10-CM | POA: Diagnosis not present

## 2023-12-13 DIAGNOSIS — E1165 Type 2 diabetes mellitus with hyperglycemia: Secondary | ICD-10-CM | POA: Diagnosis not present

## 2023-12-13 DIAGNOSIS — R5383 Other fatigue: Secondary | ICD-10-CM | POA: Diagnosis not present

## 2024-03-26 DIAGNOSIS — E1165 Type 2 diabetes mellitus with hyperglycemia: Secondary | ICD-10-CM | POA: Diagnosis not present

## 2024-03-26 DIAGNOSIS — Z299 Encounter for prophylactic measures, unspecified: Secondary | ICD-10-CM | POA: Diagnosis not present

## 2024-03-26 DIAGNOSIS — I10 Essential (primary) hypertension: Secondary | ICD-10-CM | POA: Diagnosis not present

## 2024-03-26 DIAGNOSIS — M179 Osteoarthritis of knee, unspecified: Secondary | ICD-10-CM | POA: Diagnosis not present

## 2024-04-09 DIAGNOSIS — Z860100 Personal history of colon polyps, unspecified: Secondary | ICD-10-CM | POA: Diagnosis not present

## 2024-04-09 DIAGNOSIS — K5900 Constipation, unspecified: Secondary | ICD-10-CM | POA: Diagnosis not present

## 2024-04-09 DIAGNOSIS — K219 Gastro-esophageal reflux disease without esophagitis: Secondary | ICD-10-CM | POA: Diagnosis not present

## 2024-04-09 DIAGNOSIS — K222 Esophageal obstruction: Secondary | ICD-10-CM | POA: Diagnosis not present

## 2024-04-09 DIAGNOSIS — Z800 Family history of malignant neoplasm of digestive organs: Secondary | ICD-10-CM | POA: Diagnosis not present

## 2024-04-23 DIAGNOSIS — Z1231 Encounter for screening mammogram for malignant neoplasm of breast: Secondary | ICD-10-CM | POA: Diagnosis not present

## 2024-05-03 DIAGNOSIS — I10 Essential (primary) hypertension: Secondary | ICD-10-CM | POA: Diagnosis not present

## 2024-05-03 DIAGNOSIS — E119 Type 2 diabetes mellitus without complications: Secondary | ICD-10-CM | POA: Diagnosis not present

## 2024-05-03 DIAGNOSIS — Z6841 Body Mass Index (BMI) 40.0 and over, adult: Secondary | ICD-10-CM | POA: Diagnosis not present

## 2024-05-03 DIAGNOSIS — N1831 Chronic kidney disease, stage 3a: Secondary | ICD-10-CM | POA: Diagnosis not present

## 2024-05-03 DIAGNOSIS — Z299 Encounter for prophylactic measures, unspecified: Secondary | ICD-10-CM | POA: Diagnosis not present
